# Patient Record
Sex: Male | Born: 1985 | Race: White | Hispanic: No | Smoking: Current every day smoker
Health system: Southern US, Community
[De-identification: ages and names within clinical notes are randomized; demographics above are authoritative.]

## PROBLEM LIST (undated history)

## (undated) DIAGNOSIS — F419 Anxiety disorder, unspecified: Secondary | ICD-10-CM

## (undated) DIAGNOSIS — F1110 Opioid abuse, uncomplicated: Secondary | ICD-10-CM

---
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
## 2006-12-15 ENCOUNTER — Ambulatory Visit (HOSPITAL_BASED_OUTPATIENT_CLINIC_OR_DEPARTMENT_OTHER): Admission: RE | Admit: 2006-12-15 | Discharge: 2006-12-15 | Payer: Self-pay | Admitting: Orthopedic Surgery

## 2010-06-02 ENCOUNTER — Emergency Department (HOSPITAL_COMMUNITY): Admission: EM | Admit: 2010-06-02 | Discharge: 2010-06-02 | Payer: Self-pay | Admitting: Emergency Medicine

## 2011-05-14 NOTE — Op Note (Signed)
Tyler Graves, HERMIZ             ACCOUNT NO.:  [id number]   MEDICAL RECORD NO.:  [id number]          PATIENT TYPE:  AMB   LOCATION:  DSC                          FACILITY:  [facility]   PHYSICIAN:  Katy Fitch. Sypher, M.D. DATE OF BIRTH:  13-Dec-1986   DATE OF PROCEDURE:  12/15/2006  DATE OF DISCHARGE:                               OPERATIVE REPORT   PREOPERATIVE DIAGNOSIS:  Subretinacular dorsal mass, right wrist.   POSTOPERATIVE DIAGNOSIS:  Subretinacular dorsal mass, right wrist.   OPERATION:  Excision of dorsal myxoid/ganglion cyst from overlying  scapholunate interosseous ligament.   OPERATING SURGEON:  Katy Fitch. Sypher, M.D.   ASSISTANT:  Molly Maduro Dasnoit PA-C.   ANESTHESIA:  General by LMA, supervising anesthesiologist is Dr. Krista Blue.   INDICATIONS:  [name] is a 25 year old student referred by Dr.  Shaune Pollack for evaluation and management of a mass on the dorsal aspect  of his right wrist.  [name] had had when he dorsiflexed the wrist  fully or placed weight on his hand in a push-up position.   Clinical examination revealed a small M & M-size mass on the dorsal  aspect of his wrist overlying the scapholunate ligament.  Plain films of  the wrist were nondiagnostic.   We recommended proceeding to excisional biopsy anticipating a  subretinacular dorsal myxoid ganglion cyst.   PROCEDURE:  Drayton Tieu was brought to the operating room and placed  in supine position upon the operating table.  Following an anesthesia  consult by Dr. Krista Blue, general anesthesia by LMA was recommended.  Following induction of general anesthesia under Dr. Robina Ade direct  supervision, a pneumatic tourniquet was applied to [name]'s proximal  right brachium.  The arm was then prepped with Betadine soap and  solution and sterilely draped.  Following exsanguination of the right  arm with an Esmarch bandage, the arterial tourniquet was inflated to 220  mmHg.   The procedure commenced  with a short transverse scission directly over  the scapholunate ligament region.  The subcutaneous tissues were  carefully divided taking care to identify the extensor retinaculum.   The retinaculum was split in the line of its fibers.  The extensor  tendons were retracted in the fourth dorsal compartment in an ulnar  direction.  The mass was circumferentially dissected.  The capsule was  split in the line of its fibers proximal to the dorsal intercarpal  ligament.  The mass was circumferentially dissected and found be a  myxoid mucinous cyst exiting from overlying the scapholunate ligament.  The dorsal capsule involving the cyst and cyst were excised with a  rongeur.  A radiocarpal arthrotomy was accomplished as well as a  midcarpal arthrotomy.  No intra-articular cysts were noted.  Bleeding  points along the margin of the resection line were electrocauterized  with bipolar current.  There were no other masses or predicaments noted.  The scapholunate interosseous ligament appeared to be intact.   The wound was then irrigated and closed with subdermal sutures of 4-0  Vicryl and intradermal 3-0 Prolene with Steri-Strips.   There were no apparent complications.   [name]  tolerated the surgery and anesthesia well.  She was transferred  to the recovery room with stable vital signs.   NOTE:  For aftercare he is provided Vicodin 5 mg one p.o. q.4-6h. p.r.n.  pain, 20 tablets without refill.      Katy Fitch Sypher, M.D.  Electronically Signed     RVS/MEDQ  D:  12/15/2006  T:  12/15/2006  Job:  [id number]   cc:   Duncan Dull, M.D.

## 2012-07-15 ENCOUNTER — Encounter (HOSPITAL_COMMUNITY): Payer: Self-pay

## 2012-07-15 ENCOUNTER — Encounter (HOSPITAL_COMMUNITY): Payer: Self-pay | Admitting: *Deleted

## 2012-07-15 ENCOUNTER — Inpatient Hospital Stay (HOSPITAL_COMMUNITY)
Admission: AD | Admit: 2012-07-15 | Discharge: 2012-07-20 | DRG: 897 | Disposition: A | Discharge status: Home / Self Care | Payer: Commercial Managed Care - PPO | Source: Other Acute Inpatient Hospital | Attending: Emergency Medicine | Admitting: Emergency Medicine

## 2012-07-15 ENCOUNTER — Emergency Department (HOSPITAL_COMMUNITY)
Admission: EM | Admit: 2012-07-15 | Discharge: 2012-07-15 | Disposition: A | Discharge status: Other Acute Inpatient Hospital | Payer: Commercial Managed Care - PPO | Attending: Emergency Medicine | Admitting: Emergency Medicine

## 2012-07-15 DIAGNOSIS — T424X4A Poisoning by benzodiazepines, undetermined, initial encounter: Secondary | ICD-10-CM | POA: Insufficient documentation

## 2012-07-15 DIAGNOSIS — F19239 Other psychoactive substance dependence with withdrawal, unspecified: Secondary | ICD-10-CM

## 2012-07-15 DIAGNOSIS — F19939 Other psychoactive substance use, unspecified with withdrawal, unspecified: Principal | ICD-10-CM

## 2012-07-15 DIAGNOSIS — T43502A Poisoning by unspecified antipsychotics and neuroleptics, intentional self-harm, initial encounter: Secondary | ICD-10-CM | POA: Insufficient documentation

## 2012-07-15 DIAGNOSIS — F1920 Other psychoactive substance dependence, uncomplicated: Secondary | ICD-10-CM | POA: Diagnosis present

## 2012-07-15 DIAGNOSIS — F17200 Nicotine dependence, unspecified, uncomplicated: Secondary | ICD-10-CM | POA: Diagnosis present

## 2012-07-15 DIAGNOSIS — R569 Unspecified convulsions: Secondary | ICD-10-CM

## 2012-07-15 DIAGNOSIS — F1994 Other psychoactive substance use, unspecified with psychoactive substance-induced mood disorder: Secondary | ICD-10-CM | POA: Diagnosis present

## 2012-07-15 DIAGNOSIS — T438X2A Poisoning by other psychotropic drugs, intentional self-harm, initial encounter: Secondary | ICD-10-CM | POA: Insufficient documentation

## 2012-07-15 DIAGNOSIS — F13939 Sedative, hypnotic or anxiolytic use, unspecified with withdrawal, unspecified: Secondary | ICD-10-CM

## 2012-07-15 DIAGNOSIS — F13239 Sedative, hypnotic or anxiolytic dependence with withdrawal, unspecified: Secondary | ICD-10-CM

## 2012-07-15 DIAGNOSIS — T50902A Poisoning by unspecified drugs, medicaments and biological substances, intentional self-harm, initial encounter: Secondary | ICD-10-CM

## 2012-07-15 HISTORY — DX: Anxiety disorder, unspecified: F41.9

## 2012-07-15 LAB — CBC
Hemoglobin: 14.3 g/dL (ref 13.0–17.0)
MCH: 30.6 pg (ref 26.0–34.0)
MCHC: 35.7 g/dL (ref 30.0–36.0)
Platelets: 183 10*3/uL (ref 150–400)
RBC: 4.67 MIL/uL (ref 4.22–5.81)

## 2012-07-15 LAB — BASIC METABOLIC PANEL
Calcium: 9.3 mg/dL (ref 8.4–10.5)
GFR calc Af Amer: >90 mL/min (ref >90)
GFR calc non Af Amer: >90 mL/min (ref >90)
Potassium: 4 mEq/L (ref 3.5–5.1)
Sodium: 136 mEq/L (ref 135–145)

## 2012-07-15 LAB — RAPID URINE DRUG SCREEN, HOSP PERFORMED
Barbiturates: NOT DETECTED
Benzodiazepines: POSITIVE — AB
Cocaine: NOT DETECTED

## 2012-07-15 LAB — ACETAMINOPHEN LEVEL: Acetaminophen (Tylenol), Serum: <15 ug/mL (ref 10–30)

## 2012-07-15 LAB — ETHANOL: Alcohol, Ethyl (B): 11 mg/dL (ref 0–11)

## 2012-07-15 LAB — SALICYLATE LEVEL: Salicylate Lvl: <2 mg/dL — ABNORMAL LOW (ref 2.8–20.0)

## 2012-07-15 MED ORDER — METHADONE HCL 5 MG PO TABS: 110.0000 mg | ORAL_TABLET | Freq: Every day | ORAL | Status: DC

## 2012-07-15 MED ORDER — NICOTINE 14 MG/24HR TD PT24
14.0000 mg | MEDICATED_PATCH | Freq: Once | TRANSDERMAL | Status: DC
  Administered 2012-07-15: 14 mg via TRANSDERMAL
  Filled 2012-07-15: qty 1

## 2012-07-15 MED ORDER — IBUPROFEN 600 MG PO TABS: 600.0000 mg | ORAL_TABLET | Freq: Three times a day (TID) | ORAL | Status: DC | PRN

## 2012-07-15 MED ORDER — METHADONE HCL 10 MG PO TABS
110.0000 mg | ORAL_TABLET | Freq: Every day | ORAL | Status: DC
  Administered 2012-07-15: 110 mg via ORAL
  Filled 2012-07-15: qty 11

## 2012-07-15 NOTE — ED Notes (Signed)
ACT team at bedside.  

## 2012-07-15 NOTE — ED Notes (Signed)
Bed:WA28<BR> Expected date:<BR> Expected time:<BR> Means of arrival:<BR> Comments:<BR>

## 2012-07-15 NOTE — ED Provider Notes (Addendum)
Patient sleepy, arouses easily .alert ambulatory Glasgow Coma Score 15. Stable for transfer to behavioral [facility], MD 07/15/12 2210  Doug Sou, MD 07/16/12 1610

## 2012-07-15 NOTE — ED Provider Notes (Addendum)
History     CSN: [id number]  Arrival date & time 07/15/12  0217   First MD Initiated Contact with Patient 07/15/12 [phone]      Chief Complaint  Patient presents with  . Drug Overdose    (Consider location/radiation/quality/duration/timing/severity/associated sxs/prior treatment) HPI Level 5 Caveat: drug intoxication, altered LOC. This is a 26 year old white male who reportedly took 15-2010 mg Xanax tablets and drank several beers. This was apparently triggered by difficulties with his family and he did type a suicide letter. He has a history of schizophrenia and substance abuse. He was noted to be very somnolent on arrival and this persists. He is otherwise been hemodynamically stable per nursing staff.   No past medical history on file.  No past surgical history on file.  No family history on file.  History  Substance Use Topics  . Smoking status: Not on file  . Smokeless tobacco: Not on file  . Alcohol Use: Not on file      Review of Systems  Unable to perform ROS   Allergies  Review of patient's allergies indicates not on file.  Home Medications  No current outpatient prescriptions on file.  BP 94/60  Pulse 60  Temp 97.9 F (36.6 C) (Oral)  Resp 12  SpO2 96%  Physical Exam General: Well-developed, well-nourished male in no acute distress; appearance consistent with age of record HENT: normocephalic, atraumatic Eyes: pupils equal round and reactive to light; extraocular muscle function cannot be assessed Neck: supple Heart: regular rate and rhythm Lungs: clear to auscultation bilaterally Abdomen: soft; nondistended; nontender Extremities: No deformity; pulses normal Neurologic: Somnolent; will arouse but extremely dysarthric and ataxic; noted to move all extremities Skin: Warm and dry    ED Course  Procedures (including critical care time)     MDM   Nursing notes and vitals signs, including pulse oximetry, reviewed.  Summary of this visit's  results, reviewed by myself:  Labs:  Results for orders placed during the hospital encounter of 07/15/12  BASIC METABOLIC PANEL      Component Value Range   Sodium 136  135 - 145 mEq/L   Potassium 4.0  3.5 - 5.1 mEq/L   Chloride 99  96 - 112 mEq/L   CO2 27  19 - 32 mEq/L   Glucose, Bld 85  70 - 99 mg/dL   BUN 11  6 - 23 mg/dL   Creatinine, Ser 6.57  0.50 - 1.35 mg/dL   Calcium 9.3  8.4 - 84.6 mg/dL   GFR calc non Af Amer >90  >90 mL/min   GFR calc Af Amer >90  >90 mL/min  CBC      Component Value Range   WBC 7.5  4.0 - 10.5 K/uL   RBC 4.67  4.22 - 5.81 MIL/uL   Hemoglobin 14.3  13.0 - 17.0 g/dL   HCT 96.2  95.2 - 84.1 %   MCV 85.9  78.0 - 100.0 fL   MCH 30.6  26.0 - 34.0 pg   MCHC 35.7  30.0 - 36.0 g/dL   RDW 32.4  40.1 - 02.7 %   Platelets 183  150 - 400 K/uL  ACETAMINOPHEN LEVEL      Component Value Range   Acetaminophen (Tylenol), Serum <15.0  10 - 30 ug/mL  SALICYLATE LEVEL      Component Value Range   Salicylate Lvl <2.0 (*) 2.8 - 20.0 mg/dL  URINE RAPID DRUG SCREEN (HOSP PERFORMED)      Component Value Range  Opiates NONE DETECTED  NONE DETECTED   Cocaine NONE DETECTED  NONE DETECTED   Benzodiazepines POSITIVE (*) NONE DETECTED   Amphetamines NONE DETECTED  NONE DETECTED   Tetrahydrocannabinol NONE DETECTED  NONE DETECTED   Barbiturates NONE DETECTED  NONE DETECTED  ETHANOL      Component Value Range   Alcohol, Ethyl (B) 11  0 - 11 mg/dL    Date: 40/98/1191 4:78 AM  Rate: 70  Rhythm: normal sinus rhythm  QRS Axis: normal  Intervals: normal  ST/T Wave abnormalities: normal  Conduction Disutrbances: none  Narrative Interpretation: unremarkable  Comparison with previous EKG: None available  ACT to evaluate after patient recovers from his sedatives.          Hanley Seamen, MD 07/15/12 2956  Hanley Seamen, MD 07/15/12 [phone]

## 2012-07-15 NOTE — BH Assessment (Signed)
Assessment Note   Tyler Graves is an 26 y.o. male who presented to [facility] Emergency Department due to suicidal ideations and alleged attempt by taking 15-20 10mg  of Xanax pills along with consuming several beers. During assessment pt was observed by writer to exhibit slow speech and flat affect. Patient reported to writer that several psychosocial factors have caused his recent depression. Patient disclosed that his father's alcoholism has caused stress within their relationship. Patient stated that his father became extremely upset with him last night. "He was mad because I walked in on him watching adult stuff. I apologized. I really didn't care cause we are all men. We are all bachelors. He keeps my anxiety high" Patient stated that along with the stress from his father, he is also dealing with financial strain and conflict with his son's mother. "She told me that she wishes I wasn't Jacob's father. She wishes that I was a dead beat dead. It's too much for me." Patient reported depressive symptoms such as isolation and recent increase in consuming alcohol. Patient disclosed to Clinical research associate that he is currently receiving outpatient substance abuse treatment at a methadone clinic in [place], [place]. Patient reported no past history of inpatient psychiatric treatment and now desires to receive treatment for his depression and emotional distress. Patient denies HI/AVH at this time.   Axis I: Mood Disorder NOS Axis II: No diagnosis Axis III: No past medical history on file. Axis IV: other psychosocial or environmental problems, problems related to social environment and problems with primary support group Axis V: 41-50 serious symptoms  Past Medical History: No past medical history on file.  No past surgical history on file.  Family History: No family history on file.  Social History:  reports that he has been smoking Cigarettes.  He has been smoking about .25 packs per day. He does not have any  smokeless tobacco history on file. He reports that he drinks about 1.8 ounces of alcohol per week. He reports that he does not use illicit drugs.  Additional Social History:  Alcohol / Drug Use Pain Medications: See MAR Prescriptions: See MAR Over the Counter: See MAR History of alcohol / drug use?: Yes Substance #1 Name of Substance 1: ETOH 1 - Age of First Use: 19 1 - Amount (size/oz): varies 1 - Frequency: rarely 1 - Duration: years 1 - Last Use / Amount: 07/14/12 (3 Beers)  CIWA: CIWA-Ar BP: 104/58 mmHg Pulse Rate: 62  COWS:    Allergies: Not on File  Home Medications:  (Not in a hospital admission)  OB/GYN Status:  No LMP for male patient.  General Assessment Data Location of Assessment: [facility] ED Living Arrangements: Parent;Other relatives (Dad & Brother) Can pt return to current living arrangement?: Yes Admission Status: Voluntary Is patient capable of signing voluntary admission?: Yes Transfer from: Acute Hospital Referral Source: Self/Family/Friend  Education Status Is patient currently in school?: No  Risk to self Suicidal Ideation: Yes-Currently Present Suicidal Intent: Yes-Currently Present Is patient at risk for suicide?: Yes Suicidal Plan?: Yes-Currently Present Specify Current Suicidal Plan: OD on Xanax Access to Means: Yes Specify Access to Suicidal Means: Access to Rx's  What has been your use of drugs/alcohol within the last 12 months?: ETOH Previous Attempts/Gestures: No How many times?: 0  Triggers for Past Attempts: None known Intentional Self Injurious Behavior: None Family Suicide History: No Recent stressful life event(s): Conflict (Comment) (Conflict with father who is an alcoholic) Persecutory voices/beliefs?: No Depression: Yes Depression Symptoms: Isolating;Guilt;Loss of interest  in usual pleasures;Feeling worthless/self pity Substance abuse history and/or treatment for substance abuse?: Yes Suicide prevention information given to  non-admitted patients: Not applicable  Risk to Others Homicidal Ideation: No Thoughts of Harm to Others: No Current Homicidal Intent: No Current Homicidal Plan: No Access to Homicidal Means: No Identified Victim: None Reported History of harm to others?: No Assessment of Violence: None Noted Violent Behavior Description: Pt is calm and cooperative Does patient have access to weapons?: No Criminal Charges Pending?: No Does patient have a court date: No  Psychosis Hallucinations: None noted Delusions: None noted  Mental Status Report Appear/Hygiene: Other (Comment) (Appropriate) Eye Contact: Good Motor Activity: Freedom of movement Speech: Slow Level of Consciousness: Quiet/awake Mood: Depressed;Apprehensive Affect: Appropriate to circumstance;Apprehensive;Depressed Anxiety Level: None Thought Processes: Coherent;Relevant Judgement: Impaired Orientation: Person;Place;Time;Situation Obsessive Compulsive Thoughts/Behaviors: None  Cognitive Functioning Concentration: Decreased Memory: Recent Intact;Remote Intact IQ: Average Insight: Fair Impulse Control: Poor Appetite: Fair Weight Loss: 0  Weight Gain: 0  Sleep: Decreased Total Hours of Sleep: 5  Vegetative Symptoms: None  ADLScreening [facility] Assessment Services) Patient's cognitive ability adequate to safely complete daily activities?: Yes Patient able to express need for assistance with ADLs?: Yes Independently performs ADLs?: Yes  Abuse/Neglect [facility]) Physical Abuse: Denies Verbal Abuse: Denies Sexual Abuse: Denies  Prior Inpatient Therapy Prior Inpatient Therapy: No  Prior Outpatient Therapy Prior Outpatient Therapy: Yes Prior Therapy Dates: Current Prior Therapy Facilty/Provider(s): Outpatient Methadone Clinic Reason for Treatment: SA  ADL Screening (condition at time of admission) Patient's cognitive ability adequate to safely complete daily activities?: Yes Patient able to express need for assistance  with ADLs?: Yes Independently performs ADLs?: Yes Weakness of Legs: None Weakness of Arms/Hands: None  Home Assistive Devices/Equipment Home Assistive Devices/Equipment: None  Therapy Consults (therapy consults require a physician order) PT Evaluation Needed: No OT Evalulation Needed: No SLP Evaluation Needed: No Abuse/Neglect Assessment (Assessment to be complete while patient is alone) Physical Abuse: Denies Verbal Abuse: Denies Sexual Abuse: Denies Exploitation of patient/patient's resources: Denies Self-Neglect: Denies Values / Beliefs Cultural Requests During Hospitalization: None Spiritual Requests During Hospitalization: None Consults Spiritual Care Consult Needed: No Social Work Consult Needed: No      Additional Information 1:1 In Past 12 Months?: No CIRT Risk: No Elopement Risk: No Does patient have medical clearance?: Yes     Disposition: Referral to [facility]    On Site Evaluation by:   Reviewed with Physician:     Paulino Door, Earl Lites C 07/15/2012 5:22 PM

## 2012-07-15 NOTE — ED Notes (Signed)
Patient is resting comfortably. 

## 2012-07-15 NOTE — ED Notes (Signed)
Alert x 4, resting, very pleasant ,v/s stable report given to Tyler Bridge RN Pt transferred to [facility] ed . Will monitor.

## 2012-07-15 NOTE — ED Notes (Signed)
PER EMS: pt ingested 15-20 10 mg Xanax and drank 2-4 Coors Lites. Was more alert on EMS arrival, no very sleepy. Reported difficulties with his family and typed a suicide letter. HX: substance abuse, schizophrenia, methadone use. NKDA.

## 2012-07-15 NOTE — Tx Team (Signed)
Initial Interdisciplinary Treatment Plan  PATIENT STRENGTHS: (choose at least two) Ability for insight Work skills  PATIENT STRESSORS: Financial difficulties Loss of not being able to see son regularly* Marital or family conflict Substance abuse   PROBLEM LIST: Problem List/Patient Goals Date to be addressed Date deferred Reason deferred Estimated date of resolution  Depression 07/15/2012     SI      Anxiety      Panic attacks                                     DISCHARGE CRITERIA:  Adequate post-discharge living arrangements Improved stabilization in mood, thinking, and/or behavior Need for constant or close observation no longer present Reduction of life-threatening or endangering symptoms to within safe limits Safe-care adequate arrangements made  PRELIMINARY DISCHARGE PLAN: Return to previous living arrangement  PATIENT/FAMIILY INVOLVEMENT: This treatment plan has been presented to and reviewed with the patient, Tyler Graves, and/or family member.  The patient and family have been given the opportunity to ask questions and make suggestions.  [name] 07/15/2012, 11:54 PM

## 2012-07-15 NOTE — ED Notes (Signed)
Pt received to TCU Rm 28, ambulatory, alert/oriented. Sitter present. Belongings received and put in LOCKER 28.

## 2012-07-15 NOTE — BHH Counselor (Signed)
Per Gabriel Cirri at [facility] pt has been accepted by Se[facility] Colonel to [facility], [phone]. EDP and RN notified. Support paperwork signed and faxed.

## 2012-07-15 NOTE — ED Notes (Signed)
Pt very talkative but pleasant.

## 2012-07-15 NOTE — ED Notes (Signed)
Documentation error. No Foley inserted.

## 2012-07-16 DIAGNOSIS — F1994 Other psychoactive substance use, unspecified with psychoactive substance-induced mood disorder: Secondary | ICD-10-CM | POA: Diagnosis present

## 2012-07-16 MED ORDER — NICOTINE 21 MG/24HR TD PT24
21.0000 mg | MEDICATED_PATCH | Freq: Every day | TRANSDERMAL | Status: DC
  Administered 2012-07-16: 21 mg via TRANSDERMAL

## 2012-07-16 MED ORDER — NICOTINE 21 MG/24HR TD PT24
MEDICATED_PATCH | TRANSDERMAL | Status: AC
  Administered 2012-07-16: 21 mg via TRANSDERMAL
  Filled 2012-07-16: qty 1

## 2012-07-16 MED ORDER — MAGNESIUM HYDROXIDE 400 MG/5ML PO SUSP: 30.0000 mL | Freq: Every day | ORAL | Status: DC | PRN

## 2012-07-16 MED ORDER — ACETAMINOPHEN 325 MG PO TABS
650.0000 mg | ORAL_TABLET | Freq: Four times a day (QID) | ORAL | Status: DC | PRN
  Administered 2012-07-18 – 2012-07-19 (×2): 650 mg via ORAL
  Filled 2012-07-16: qty 2

## 2012-07-16 MED ORDER — TRAZODONE HCL 50 MG PO TABS
50.0000 mg | ORAL_TABLET | Freq: Every evening | ORAL | Status: DC | PRN
  Administered 2012-07-16 – 2012-07-18 (×2): 50 mg via ORAL
  Filled 2012-07-16 (×2): qty 1

## 2012-07-16 MED ORDER — ALUM & MAG HYDROXIDE-SIMETH 200-200-20 MG/5ML PO SUSP: 30.0000 mL | ORAL | Status: DC | PRN

## 2012-07-16 MED ORDER — NICOTINE 21 MG/24HR TD PT24
21.0000 mg | MEDICATED_PATCH | Freq: Every day | TRANSDERMAL | Status: DC
  Administered 2012-07-17 – 2012-07-18 (×2): 21 mg via TRANSDERMAL
  Filled 2012-07-16 (×7): qty 1

## 2012-07-16 NOTE — Progress Notes (Signed)
[facility] Group Notes:  (Counselor/Nursing/MHT/Case Management/Adjunct)  07/16/2012 3:35 PM  Type of Therapy:  After care Planning Group  Pt.  participated in after care planning group and was given [facility] Suicide prevention information and crisis numbers and agreed to use them if needed. Pt. Agreed to use them if needed. Pt. Was also given information on support groups in [employer]. As well as information on the [employer].  The group was told that our monthly theme for today was Healthy supports and booklets were given out and materials in the booklet were explained. Pt. Spoke about just getting to the hospital today and states came to [facility] for depression and a SI attempt. The pt. Stated he was a little drowsy . Pt. participated in group fully and was told that he would be seeing a doctor or PA today.  :   Neila Gear 07/16/2012, 3:35 PM
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
## 2012-07-16 NOTE — Progress Notes (Signed)
Patient ID: Tyler Graves, male   DOB: 17-Feb-1986, 26 y.o.   MRN: [id number]   D: Patient pleasant on approach today. Concerned about not taking his methadone today but physician wants to verify it with the clinic prior to putting in a order for this. Clinic is not open today. Did express concern about withdrawal symptoms. He said to call if any withdrawal symptoms present tonight. Patient said he may be ok as long as he can get something for sleep and trazodone is ordered for him. Reports depression and hopelessness "6" on scale. Currently denies any SI today. Says that he wants to talk with his family and try to resolve issues that lead to his hospitalization. A: Staff will monitor and encourage group attendance. R: Patient calm and cooperating with staff

## 2012-07-16 NOTE — H&P (Signed)
  Pt was seen by me today and I agree with the key elements documented in H&P.  

## 2012-07-16 NOTE — H&P (Signed)
Psychiatric Admission Assessment Adult  Patient Identification:  Tyler Graves  Date of Evaluation:  07/16/2012  Chief Complaint:  Mood Disorder NOS  History of Present Illness: This is a 26 year old caucasian male, admitted to [facility] from the [facility] ED with complaints of suicide attempt by overdose on Xanax tablets. Patient reports, "I attempted to commit suicide Friday night. I bought a bunch of Xanax pills from the streets, came home swallowed all of them and went to bed to die. However, my brother woke up in the middle of the night, tried to wake me up, but I could not respond and they called 911. I attempted to kill myself because I don't like how things are going in my home. The ongoing stress and conflicts finally pushed me to the edge and I almost succumbed to it as well. I have been having suicidal thoughts for a while. My family life is getting worse and is worsening by the day. The way my brother and father are treating me is distressing me. I also have to deal with the mother of my son. She undermines the fact that I am the father this my child, but she said that she wishes that I was not the father. I am depressed for 6 months, but getting into this hospital, I realized that I am not depressed any more. I used to abuse pain killers just to feel okay. But, I stopped all that and currently doing the methadone treatment.  This is my first suicide attempt. Once I get out of here, I will sit down with my father and brother, man to man and discuss with them how else we can live with each other and not drive each other crazy. The stress is unnecessary. I would not want to be on depression medicine because I am not depressed. I want my methadone pills, if I can't have them, I need to be discharged right away".  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  Violent Behaviors: None reported   Past Medical History:   Past Medical History  Diagnosis Date  . Anxiety      Allergies:  No Known Allergies  PTA Medications: Prescriptions prior to admission  Medication Sig Dispense Refill  . METHADONE HCL PO Take 110 mg by mouth daily.         Substance Abuse History in the last 12 months: Substance Age of 1st Use Last Use Amount Specific Type  Nicotine 16 Prior to hosp 3/4 of a pack daily Cigarettes  Alcohol Denies use     Cannabis Denies use     Opiates "I stopped abusing Opiates in Aug of 2012"     Cocaine Denies use     Methamphetamines Denies use     LSD Denies use     Ecstasy Denies use     Benzodiazepines Denies use     Caffeine      Inhalants      Others:  Consequences of Substance Abuse: Medical Consequences:  Liver damage Legal Consequences:  Arrests, jail time Family Consequences:  Family discord  Social History: Current Place of Residence: [place]   Place of Birth: [place]  Family Members: "My father, brother and my son"  Marital Status:  Single  Children: 1  Sons:1  Daughters: 0  Relationships: "I'm single"  Education:  [employer] Financial [employer] Problems/Performance: None reported  Religious Beliefs/Practices: None reported  History of Abuse (Emotional/Phsycial/Sexual): None reported  Occupational Experiences: Employed  Hotel manager History:  None.  Legal History: None reported  Hobbies/Interests: None  reported  Family History:  History reviewed. No pertinent family history.  Mental Status Examination/Evaluation: Objective:  Appearance: Casual and sluggish  Eye Contact::  Fair  Speech:  Slow  Volume:  Normal  Mood:  "I am not depressed"  Affect:  Flat  Thought Process:  Coherent  Orientation:  Full  Thought Content:  Rumination  Suicidal Thoughts:  No  Homicidal Thoughts:  No  Memory:  Immediate;   Good Recent;   Good Remote;   Good  Judgement:  Poor  Insight:  Lacking  Psychomotor Activity:  Normal  Concentration:  Good  Recall:  Good  Akathisia:  No  Handed:  Right  AIMS (if indicated):     Assets:  Desire for Improvement  Sleep:  Number of Hours: 5.25     Laboratory/X-Ray: None Psychological Evaluation(s)      Assessment:    AXIS I:  Substance Induced Mood Disorder and Suicide attempts. AXIS II:  Deferred AXIS III:   Past Medical History  Diagnosis Date  . Anxiety    AXIS IV:  Familial stressors AXIS V:  11-20 some danger of hurting self or others possible OR occasionally fails to maintain minimal personal hygiene OR gross impairment in communication  Treatment Plan/Recommendations: Admit for safety and stabilization Patient declines to be on any antidepressant medications. Would want methadone instead. Group counseling and activities.  Treatment Plan Summary: Daily contact with patient to assess and evaluate symptoms and progress in treatment Medication management  Current Medications:  Current Facility-Administered Medications  Medication Dose Route Frequency Provider Last Rate Last Dose  . acetaminophen (TYLENOL) tablet 650 mg  650 mg Oral Q6H PRN Sanjuana Kava, NP      . alum & mag hydroxide-simeth (MAALOX/MYLANTA) 200-200-20 MG/5ML suspension 30 mL  30 mL Oral Q4H PRN Sanjuana Kava, NP      . magnesium hydroxide (MILK OF MAGNESIA) suspension 30 mL  30 mL Oral Daily PRN Sanjuana Kava, NP      . traZODone (DESYREL) tablet 50 mg  50 mg Oral QHS PRN  Sanjuana Kava, NP       Facility-Administered Medications Ordered in Other Encounters  Medication Dose Route Frequency Provider Last Rate Last Dose  . DISCONTD: ibuprofen (ADVIL,MOTRIN) tablet 600 mg  600 mg Oral Q8H PRN Carlisle Beers Molpus, MD      . DISCONTD: methadone (DOLOPHINE) tablet 110 mg  110 mg Oral Daily Nathan R. Pickering, MD      . DISCONTD: methadone (DOLOPHINE) tablet 110 mg  110 mg Oral Daily Provider Default, MD   110 mg at 07/15/12 1338  . DISCONTD: nicotine (NICODERM CQ - dosed in mg/24 hours) patch 14 mg  14 mg Transdermal Once [employer]. Pickering, MD   14 mg at 07/15/12 1540    Observation Level/Precautions:  Q 15 minute checks for safety  Laboratory:  Per Ed lab findings; (+) for benzodiazepines.  Psychotherapy:  Group  Medications:  See medication list  Routine PRN Medications:  Yes  Consultations: None indicated at this time   Discharge Concerns:  Safety  Other:     Armandina Stammer I 7/21/201311:37 AM

## 2012-07-16 NOTE — [facility] Suicide Risk Assessment (Signed)
Suicide Risk Assessment  Admission Assessment     Demographic factors:  Assessment Details Time of Assessment: Admission Current Mental Status:  See below Loss Factors:  Loss Factors: Loss of significant relationship;Legal issues Historical Factors:  Historical Factors: Family history of mental illness or substance abuse Risk Reduction Factors:  Risk Reduction Factors: Responsible for children under 25 years of age;Sense of responsibility to family;Religious beliefs about death;Employed;Living with another person, especially a relative  CLINICAL FACTORS:   Alcohol/Substance Abuse/Dependencies  COGNITIVE FEATURES THAT CONTRIBUTE TO RISK:  Closed-mindedness    SUICIDE RISK:   Moderate:  Frequent suicidal ideation with limited intensity, and duration, some specificity in terms of plans, no associated intent, good self-control, limited dysphoria/symptomatology, some risk factors present, and identifiable protective factors, including available and accessible social support.  PLAN OF CARE: Mental Status Examination/Evaluation:  Objective: Appearance: Casual and sluggish  Eye Contact:: Fair   Speech: Slow   Volume: Normal   Mood: not good   Affect: Flat   Thought Process: Coherent   Orientation: Full   Thought Content: Rumination   Suicidal Thoughts: No   Homicidal Thoughts: No   Memory: Immediate; Good  Recent; Good  Remote; Good   Judgement: Poor   Insight: Lacking   Psychomotor Activity: Normal   Concentration: Good   Recall: Good   Akathisia: No   Handed: Right   AIMS (if indicated):   Assets: Desire for Improvement   Sleep: Number of Hours: 5.25    Laboratory/X-Ray: None  Psychological Evaluation(s)       Assessment:  AXIS I: Opiod Dep, Benzo Abuse, r/o cognitive d/ nos AXIS II: Deferred  AXIS III:  Past Medical History   Diagnosis  Date   .  Anxiety     AXIS IV: Familial stressors  AXIS V: 11-20 some danger of hurting self or others possible OR occasionally  fails to maintain minimal personal hygiene OR gross impairment in communication  Treatment Plan/Recommendations: Admit for safety and stabilization  Patient declines to be on any antidepressant medications. Would want methadone instead.  Group counseling and activities.   Treatment Plan Summary:  Daily contact with patient to assess and evaluate symptoms and progress in treatment  Medication management Will confirm his methadone rx tomorrow as clinic is closed today. Will observe for withdrawl   Wonda Cerise 07/16/2012, 12:30 PM

## 2012-07-16 NOTE — Progress Notes (Signed)
Patient voluntarily admitted due to SI attempt by taking 15-20 (10mg ) xanax pills with 3 beers. Patient reports that he had written a suicide note which his brother found. Patient reports his stressors are his strained relationship with his baby's mother, his father and his brother. Patient reports that he lives with his father and brother and his mother lives in [place] and is not a part of their lives. Patient reports his baby's mother does not allow him to see his son on a regular basis. Patient currently denies si/hi/a/v hall. Patient is concerned about receiving his methadone while here. Writer informed patient that the doctor would evaluate his medication on tomorrow. Patient given snack, oriented to unit, safety maintained with 15 min. checks, will continue to monitor.

## 2012-07-16 NOTE — Progress Notes (Signed)
Patient ID: Tyler Graves, male   DOB: [birth year], 26 y.o.   MRN: [id number]  [facility] Notes:  (Counselor/Nursing/MHT/Case Management/Adjunct)  07/16/2012 1:15 PM  Type of Therapy:  Group Therapy, Dance/Movement Therapy   Participation Level:  Minimal  Participation Quality: Drowsey   Affect:  Flat  Cognitive:  Appropriate  Insight:  Limited  Engagement in Group:  Limited  Engagement in Therapy:  Limited  Modes of Intervention:  Clarification, Problem-solving, Role-play, Socialization and Support  Summary of Progress/Problems: Therapist discussed the importance of supports and the reason why supports are needed. Therapist discussed the importance of recognizing supports that are good or bad. Therapist asked group to define the term support and what does support mean to them Therapist asked group to name at least five supports in their lives. Therapist asked group what can you do to support yourself when there are no supports available      Rhunette Croft 07/16/2012. 4:05 PM

## 2012-07-17 DIAGNOSIS — F1920 Other psychoactive substance dependence, uncomplicated: Secondary | ICD-10-CM | POA: Diagnosis present

## 2012-07-17 MED ORDER — NAPROXEN 500 MG PO TABS: 500.0000 mg | ORAL_TABLET | Freq: Two times a day (BID) | ORAL | Status: DC | PRN

## 2012-07-17 MED ORDER — CLONIDINE HCL 0.2 MG/24HR TD PTWK
0.2000 mg | MEDICATED_PATCH | TRANSDERMAL | Status: DC
  Administered 2012-07-17: 0.2 mg via TRANSDERMAL
  Filled 2012-07-17 (×2): qty 1

## 2012-07-17 MED ORDER — LOPERAMIDE HCL 2 MG PO CAPS: 2.0000 mg | ORAL_CAPSULE | ORAL | Status: DC | PRN

## 2012-07-17 MED ORDER — ONDANSETRON 4 MG PO TBDP
4.0000 mg | ORAL_TABLET | Freq: Four times a day (QID) | ORAL | Status: DC | PRN
  Administered 2012-07-18 – 2012-07-19 (×5): 4 mg via ORAL
  Filled 2012-07-17 (×2): qty 1

## 2012-07-17 MED ORDER — METHOCARBAMOL 500 MG PO TABS: 500.0000 mg | ORAL_TABLET | Freq: Three times a day (TID) | ORAL | Status: DC | PRN

## 2012-07-17 MED ORDER — BUPROPION HCL ER (SR) 100 MG PO TB12
100.0000 mg | ORAL_TABLET | Freq: Two times a day (BID) | ORAL | Status: DC
  Administered 2012-07-17 – 2012-07-18 (×2): 100 mg via ORAL
  Administered 2012-07-18: 17:00:00 via ORAL
  Administered 2012-07-20: 100 mg via ORAL
  Filled 2012-07-17 (×15): qty 1

## 2012-07-17 MED ORDER — HYDROXYZINE HCL 25 MG PO TABS: 25.0000 mg | ORAL_TABLET | Freq: Four times a day (QID) | ORAL | Status: DC | PRN

## 2012-07-17 MED ORDER — DICYCLOMINE HCL 20 MG PO TABS: 20.0000 mg | ORAL_TABLET | Freq: Four times a day (QID) | ORAL | Status: DC | PRN

## 2012-07-17 NOTE — Progress Notes (Signed)
Patient seen during d/c planning group.  He reports admitting to the hospital due to a suicide attempt.  He is currently denying SI/HI and rates symptoms at one.  Patient shared he should have talked with family about concerns rather than acting out of his thoughts.

## 2012-07-17 NOTE — Progress Notes (Signed)
[facility] MD Progress Note  07/17/2012 10:26 PM  Diagnosis:   Axis I: Substance Induced Mood Disorder and  Polysubstance Dependence Axis II: Deferred Axis III:  Past Medical History  Diagnosis Date  . Anxiety    Axis IV: other psychosocial or environmental problems, problems related to social environment and problems with primary support group Axis V: 41-50 serious symptoms  ADL's:  Intact  Sleep: Fair  Appetite:  Fair  Suicidal Ideation:  Plan:  No Intent:  No Means:  No Homicidal Ideation:  Plan:  No Intent:  No Means:  No  AEB (as evidenced by):  Mental Status Examination/Evaluation: Objective:  Appearance: Casual  Eye Contact::  Fair  Speech:  Clear and Coherent  Volume:  Normal  Mood:  Anxious, Hopeless and Irritable  Affect:  Congruent  Thought Process:  Coherent  Orientation:  Full  Thought Content:  WDL  Suicidal Thoughts:  No  Homicidal Thoughts:  No  Memory:  Immediate;   Fair Recent;   Fair Remote;   Fair  Judgement:  Impaired  Insight:  Lacking  Psychomotor Activity:  Normal  Concentration:  Fair  Recall:  Fair  Akathisia:  No  Handed:  Right  AIMS (if indicated):     Assets:  Communication Skills Desire for Improvement  Sleep:  Number of Hours: 6    Vital Signs:Blood pressure 100/67, pulse 112, temperature 98 F (36.7 C), temperature source Oral, resp. rate 16, height 5\' 7"  (1.702 m), weight 73.483 kg (162 lb). Current Medications: Current Facility-Administered Medications  Medication Dose Route Frequency Provider Last Rate Last Dose  . acetaminophen (TYLENOL) tablet 650 mg  650 mg Oral Q6H PRN Sanjuana Kava, NP      . alum & mag hydroxide-simeth (MAALOX/MYLANTA) 200-200-20 MG/5ML suspension 30 mL  30 mL Oral Q4H PRN Sanjuana Kava, NP      . buPROPion [facility] SR) 12 hr tablet 100 mg  100 mg Oral BID Mike Craze, MD   100 mg at 07/17/12 2106  . cloNIDine (CATAPRES - Dosed in mg/24 hr) patch 0.2 mg  0.2 mg Transdermal NOW Mike Craze,  MD   0.2 mg at 07/17/12 1803  . dicyclomine (BENTYL) tablet 20 mg  20 mg Oral Q6H PRN Mike Craze, MD      . hydrOXYzine (ATARAX/VISTARIL) tablet 25 mg  25 mg Oral Q6H PRN Mike Craze, MD      . loperamide (IMODIUM) capsule 2-4 mg  2-4 mg Oral PRN Mike Craze, MD      . magnesium hydroxide (MILK OF MAGNESIA) suspension 30 mL  30 mL Oral Daily PRN Sanjuana Kava, NP      . methocarbamol (ROBAXIN) tablet 500 mg  500 mg Oral Q8H PRN Mike Craze, MD      . naproxen (NAPROSYN) tablet 500 mg  500 mg Oral BID PRN Mike Craze, MD      . nicotine (NICODERM CQ - dosed in mg/24 hours) patch 21 mg  21 mg Transdermal Q0600 Sanjuana Kava, NP   21 mg at 07/17/12 1610  . ondansetron (ZOFRAN-ODT) disintegrating tablet 4 mg  4 mg Oral Q6H PRN Mike Craze, MD      . traZODone (DESYREL) tablet 50 mg  50 mg Oral QHS PRN Sanjuana Kava, NP   50 mg at 07/16/12 2110    Lab Results: No results found for this or any previous visit (from the past 48 hour(s)).  Physical Findings:  AIMS: Facial and Oral Movements Muscles of Facial Expression: None, normal Lips and Perioral Area: None, normal Jaw: None, normal Tongue: None, normal,Extremity Movements Upper (arms, wrists, hands, fingers): None, normal Lower (legs, knees, ankles, toes): None, normal, Trunk Movements Neck, shoulders, hips: None, normal, Overall Severity Severity of abnormal movements (highest score from questions above): None, normal Incapacitation due to abnormal movements: None, normal Patient's awareness of abnormal movements (rate only patient's report): No Awareness, Dental Status Current problems with teeth and/or dentures?: No Does patient usually wear dentures?: No  CIWA:    COWS:  COWS Total Score: 0   Treatment Plan Summary: Daily contact with patient to assess and evaluate symptoms and progress in treatment Medication management Mood/anxiety less than 3/10 where the scale is 1 is the best and 10 is the worst No  signs/symptoms of withdrawal from abusable substances.  Plan: Start Wellbutrin as he has been on it before with good results.  Start Clonidine protocol for s/s w/d, ie. Aches and GI discomfort.  Asking for methadone to be reinstated.  Will use Clonidine protocol instead.  Rosibel Giacobbe 07/17/2012, 10:26 PM

## 2012-07-17 NOTE — Progress Notes (Signed)
Psychoeducational Group Note  Date:  07/16/2012 Time: 1030  Group Topic/Focus:  Making Healthy Choices:   The focus of this group is to help patients identify negative/unhealthy choices they were using prior to admission and identify positive/healthier coping strategies to replace them upon discharge.  Participation Level:  Active  Participation Quality:  Appropriate and Attentive  Affect:  Appropriate  Cognitive:  Appropriate  Insight:  Good  Engagement in Group:  Good  Additional Comments:   Andres Ege 07/16/2012, 1030

## 2012-07-17 NOTE — Progress Notes (Signed)
(  D) Patient quiet, in bed during assessment. Focused on meeting with the doctor "to get my med straight." (A) Encouraged to attend all groups throughout the day while waiting for the doctor to see him. (R) Patient flat and depressed.

## 2012-07-17 NOTE — H&P (Signed)
Medical/psychiatric screening examination/treatment/procedure(s) were performed by non-physician practitioner.  I have seen and examined this patient and agree the major elements of this evaluation.  

## 2012-07-17 NOTE — Progress Notes (Signed)
Pt observed in the dayroom involved in group this evening.  He reports some anxiety, but says he is not depressed, just frustrated with his home life with his father and brother.  When he is discharged he plans to have a long talk with them about the stress he feels.  He denies SI/HI/AV.  He was started on Wellbutrin tonight.  He voices no other needs/concerns.  Pt encouraged to make his needs known to staff.  Safety checks q15 minutes.

## 2012-07-17 NOTE — Progress Notes (Signed)
Psychoeducational Group Note  Date:  07/17/2012 Time:  2015  Group Topic/Focus:  Wrap-Up Group:   The focus of this group is to help patients review their daily goal of treatment and discuss progress on daily workbooks.  Participation Level:  Active  Participation Quality:  Attentive and Drowsy  Affect:  Appropriate  Cognitive:  Alert  Insight:  Good  Engagement in Group:  Good  Additional Comments:  Patient attended and participated in wrap-up group this evening. Patient stated that he had many goals for this morning and many goals for tomorrow and was able to eat a very large meal at supper time despite feeling depressed.  Mitsuo Budnick, Newton Pigg 07/17/2012, 9:27 PM

## 2012-07-17 NOTE — [facility] Counselor (Signed)
Psychoeducational Group Note  Date:  07/17/2012 Time: 11am  Group Topic/Focus:  Dimensions of Wellness:   The focus of this group is to introduce the topic of wellness and discuss the role each dimension of wellness plays in total health.  Participation Level:  Active  Participation Quality:  Appropriate  Affect:  Appropriate  Cognitive:  Appropriate  Insight:  Good  Engagement in Group:  Limited  Additional Comments:  Aziz was quiet but participated when he was asked questions. He reported wanting to work on his emotional wellness because he often feels overwhelmed by emotions. He wants to start talking to his family more and work on recognizing his emotions.   HUQ, NADIA 07/17/2012, 12:33 PM  Cosigned by: [name], LCSW

## 2012-07-18 NOTE — Progress Notes (Signed)
(  D) Patient in bed this AM but did sit up during assessment. Flat, sad depressed affect with minimal eye contact. He reports that his pain level is "0" but he has generalized muscle aching but declined any PRN's. Patient attributed the muscle aches to detox from prior to Methadone. Patient encouraged to complete Daily Self-Inventory but has not done so. In bed after group sleeping. (A) Writer spent time reviewing his Wellbutrin medication. Patient encouraged and supported. (R) Patient minimally engaged with staff or peers this shift. Joice Lofts RN MS EdS 07/18/2012  9:56 AM

## 2012-07-18 NOTE — Tx Team (Addendum)
Interdisciplinary Treatment Plan Update (Adult)  Date:  07/18/2012  Time Reviewed:  10:32 AM   Progress in Treatment: Attending groups: Yes Participating in groups:  Yes Taking medication as prescribed:  Yes Tolerating medication: Yes Family/Significant othe contact made:  No, attempting contact with father Patient understands diagnosis: Yes Discussing patient identified problems/goals with staff:  Yes Medical problems stabilized or resolved: Yes Denies suicidal/homicidal ideation: Yes Issues/concerns per patient self-inventory:  No  Other:  New problem(s) identified: None  Reason for Continuation of Hospitalization: Withdrawal symptoms  Interventions implemented related to continuation of hospitalization:  Medication stabilization, safety checks q 15 mins, group attendance  Additional comments: Emmert is beginning to attend 12 step meetings on 300 hall tonight  Estimated length of stay:  2 days  Discharge Plan: Blease will discharge home and follow up with [facility]  New goal(s):  Review of initial/current patient goals per problem list:   1.  Goal(s): Decrease depressive symptoms to a rating of 4 or less  Met:  Yes  Target date: by discharge  As evidenced by: [name] rates depression at 1 today  2.  Goal (s): Decrease rating of anxiety to 4 or less  Met:  Yes  Target date: by discharge  As evidenced by: [name] rates anxiety at 2 today  3.  Goal(s): Reduce potential for suicide/self-harm  Met:  Yes  Target date: by discharge  As evidenced by: [name] reports no suicidal thoughts today  4.  Goal(s): Complete detox from methadone in order to reduce dependence on medications for addressing anxiety attacks  Met:  No  Target date: by discharge  As evidenced by: Andriy is currently on day 3 of discharge protocol    Attendees: Patient:  Tyler Graves 07/18/2012 10:32 AM  Family:     Physician:  Dr Orson Aloe, MD 07/18/2012 10:32 AM   Nursing:   Barrie Folk, RN 07/18/2012 10:32 AM  Case Manager:  [name], LCSW 07/18/2012 10:32 AM  Counselor:  [name], LCSW 07/18/2012 10:32 AM  Other:  Reyes Ivan, LCSWA 07/18/2012 10:32 AM  Other:     Other:     Other:      Scribe for Treatment Team:   [name], 07/18/2012 10:32 AM

## 2012-07-18 NOTE — Progress Notes (Signed)
Psychoeducational Group Note  Date:  07/18/2012 Time:  0900  Group Topic/Focus:  Recovery Goals:   The focus of this group is to identify appropriate goals for recovery and establish a plan to achieve them.  Participation Level:  Minimal  Participation Quality:  Drowsy  Affect:  Blunted, Depressed and Flat  Cognitive:  Alert  Insight:  Limited  Engagement in Group:  Limited  Additional Comments:  Tyler Graves's recovery goal was to be more open about his issues and addiction with his family. He is going to work on this by talking more to his family.   [name] 07/18/2012, 11:53 AM

## 2012-07-18 NOTE — Progress Notes (Signed)
[facility] MD Progress Note  07/18/2012 9:48 PM  Diagnosis:   Axis I: Substance Induced Mood Disorder and  Polysubstance Dependence Axis II: Deferred Axis III:  Past Medical History  Diagnosis Date  . Anxiety    Axis IV: other psychosocial or environmental problems, problems related to social environment and problems with primary support group Axis V: 41-50 serious symptoms  ADL's:  Intact  Sleep: Fair  Appetite:  Fair  Suicidal Ideation:  Plan:  No Intent:  No Means:  No Homicidal Ideation:  Plan:  No Intent:  No Means:  No  AEB (as evidenced by):  Mental Status Examination/Evaluation: Objective:  Appearance: Casual  Eye Contact::  Fair  Speech:  Clear and Coherent  Volume:  Normal  Mood:  Anxious, Dysphoric and Irritable  Affect:  Congruent  Thought Process:  Coherent  Orientation:  Full  Thought Content:  WDL  Suicidal Thoughts:  No  Homicidal Thoughts:  No  Memory:  Immediate;   Fair Recent;   Fair Remote;   Fair  Judgement:  Impaired  Insight:  Lacking  Psychomotor Activity:  Normal  Concentration:  Fair  Recall:  Fair  Akathisia:  No  Handed:  Right  AIMS (if indicated):     Assets:  Communication Skills Desire for Improvement  Sleep:  Number of Hours: 6    ROS: GI: having stomach cramps as well as N/V  MS: having leg cramps from withdrawal  Vital Signs:Blood pressure 113/78, pulse 94, temperature 98.5 F (36.9 C), temperature source Oral, resp. rate 16, height 5\' 7"  (1.702 m), weight 73.483 kg (162 lb). Current Medications: Current Facility-Administered Medications  Medication Dose Route Frequency Provider Last Rate Last Dose  . acetaminophen (TYLENOL) tablet 650 mg  650 mg Oral Q6H PRN Sanjuana Kava, NP      . alum & mag hydroxide-simeth (MAALOX/MYLANTA) 200-200-20 MG/5ML suspension 30 mL  30 mL Oral Q4H PRN Sanjuana Kava, NP      . buPROPion [facility] SR) 12 hr tablet 100 mg  100 mg Oral BID Mike Craze, MD      . cloNIDine (CATAPRES -  Dosed in mg/24 hr) patch 0.2 mg  0.2 mg Transdermal NOW Mike Craze, MD   0.2 mg at 07/17/12 1803  . dicyclomine (BENTYL) tablet 20 mg  20 mg Oral Q6H PRN Mike Craze, MD      . hydrOXYzine (ATARAX/VISTARIL) tablet 25 mg  25 mg Oral Q6H PRN Mike Craze, MD      . loperamide (IMODIUM) capsule 2-4 mg  2-4 mg Oral PRN Mike Craze, MD      . magnesium hydroxide (MILK OF MAGNESIA) suspension 30 mL  30 mL Oral Daily PRN Sanjuana Kava, NP      . methocarbamol (ROBAXIN) tablet 500 mg  500 mg Oral Q8H PRN Mike Craze, MD      . naproxen (NAPROSYN) tablet 500 mg  500 mg Oral BID PRN Mike Craze, MD      . nicotine (NICODERM CQ - dosed in mg/24 hours) patch 21 mg  21 mg Transdermal Q0600 Sanjuana Kava, NP   21 mg at 07/18/12 1610  . ondansetron (ZOFRAN-ODT) disintegrating tablet 4 mg  4 mg Oral Q6H PRN Mike Craze, MD   4 mg at 07/18/12 1820  . traZODone (DESYREL) tablet 50 mg  50 mg Oral QHS PRN Sanjuana Kava, NP   50 mg at 07/16/12 2110    Lab Results:  No results found for this or any previous visit (from the past 48 hour(s)).  Physical Findings: AIMS: Facial and Oral Movements Muscles of Facial Expression: None, normal Lips and Perioral Area: None, normal Jaw: None, normal Tongue: None, normal,Extremity Movements Upper (arms, wrists, hands, fingers): None, normal Lower (legs, knees, ankles, toes): None, normal, Trunk Movements Neck, shoulders, hips: None, normal, Overall Severity Severity of abnormal movements (highest score from questions above): None, normal Incapacitation due to abnormal movements: None, normal Patient's awareness of abnormal movements (rate only patient's report): No Awareness, Dental Status Current problems with teeth and/or dentures?: No Does patient usually wear dentures?: No  CIWA:  CIWA-Ar Total: 7  COWS:  COWS Total Score: 3   Treatment Plan Summary: Daily contact with patient to assess and evaluate symptoms and progress in treatment Medication  management Mood/anxiety less than 3/10 where the scale is 1 is the best and 10 is the worst No signs/symptoms of withdrawal from abusable substances.  Plan: Pt seen in treatment team as well as in his room.  Time was spent reviewing his plans for after the hospital as well as how to help him have a soft landing from his opiate use.  He is familiar with the withdrawal process.  I encouraged him to use the Zofran to help settle his stomach so he can eat a little food.  He did that for lunch, but then vomited it all up later.  He asked to eat in the day room so if the urge to vomit swept over him, them he could relieve himself with out disrupting the whole cafeteria.  Will do this for 24 hours only.  Will have him go to the [address] for 12 Step meetings.  Tyler Graves 07/18/2012, 9:48 PM

## 2012-07-18 NOTE — Progress Notes (Signed)
Patient seen during d/c planning group and treatment team.  He reports feeling a lot better today but endorses leg cramps and stomach discomfort. He rates depression at one and anxiety at two and denies SI/HI.  Patient will need outpatient follow up.

## 2012-07-18 NOTE — Progress Notes (Signed)
Pt observed in bed while evening group was held.  Pt lying with a cool washcloth to his forehead.  He reports he has been vomiting earlier in the day and his "goal is to keep is dinner down".  Pt is otherwise cooperative.  He has no scheduled meds for the evening.  He contracts for safety.  He denies HI/AV.  His father has been visiting since dinner.  He states he just needs to rest for the evening.  Pt voices no other needs/concerns.  Safety maintained with q15 minute checks.

## 2012-07-18 NOTE — Progress Notes (Signed)
[facility] Group Notes: (Counselor/Nursing/MHT/Case Management/Adjunct) 07/18/2012   @ 1:15-2:30PM Feelings About Diagnosis  Type of Therapy:  Group Therapy  Participation Level:  Active  Participation Quality: Appropriate, Sharing   Affect:  Blunted  Cognitive:  Appropriate  Insight:  Good  Engagement in Group: Good  Engagement in Therapy:  Good  Modes of Intervention:  Support and Exploration  Summary of Progress/Problems: [name] processed his ideas of self-direction, stating that one should be able to determine the steps and be involved in one's recovery. He went on to say that he has already begun his recovery process by talking openly to his family and dealing with some problems with son's mother. He explored how his beliefs about asking for help have kept him from doing this in the past, and how he has learned that talking and openness is not a weakness. [name] spoke up several times about his desires to have friends who could be a positive influence for him. He talked about the tendency to slide backwards in his disease of substance abuse, and explored various ways that he can be held accountable and supported in recovery, as well as making the point that it is ultimately his job to take steps to further his recovery.   [name], LCSW 07/18/2012  2:54 PM

## 2012-07-18 NOTE — Progress Notes (Signed)
D: Patient came to the window to complain of headache, nausea and insomnia. He rated his headache at 9 on a scale of 1-10 with 10 the worst. He received Zofran once within the past 4 hours, he said it didn't help; " I just need motrin or Tylenol and something to help me go back to sleep. His mood and affect flat and depressed. A: Tylenol 650 mg given for the headache, ginger ale for nausea and trazodone for insomnia. R: Patient received medications without difficulty and returned to his bedroom. Q 15 minute check continues to maintain safety.

## 2012-07-19 ENCOUNTER — Encounter (HOSPITAL_COMMUNITY): Payer: Self-pay | Admitting: Emergency Medicine

## 2012-07-19 LAB — COMPREHENSIVE METABOLIC PANEL
Albumin: 4.2 g/dL (ref 3.5–5.2)
Alkaline Phosphatase: 57 U/L (ref 39–117)
BUN: 10 mg/dL (ref 6–23)
Chloride: 102 mEq/L (ref 96–112)
Potassium: 3.9 mEq/L (ref 3.5–5.1)
Total Bilirubin: 0.4 mg/dL (ref 0.3–1.2)

## 2012-07-19 LAB — CBC WITH DIFFERENTIAL/PLATELET
Basophils Relative: 0 % (ref 0–1)
Hemoglobin: 14.6 g/dL (ref 13.0–17.0)
Lymphocytes Relative: 19 % (ref 12–46)
Lymphs Abs: 2.3 10*3/uL (ref 0.7–4.0)
MCHC: 36.9 g/dL — ABNORMAL HIGH (ref 30.0–36.0)
Monocytes Relative: 6 % (ref 3–12)
Neutro Abs: 9.5 10*3/uL — ABNORMAL HIGH (ref 1.7–7.7)
Neutrophils Relative %: 76 % (ref 43–77)
RBC: 4.83 MIL/uL (ref 4.22–5.81)
WBC: 12.5 10*3/uL — ABNORMAL HIGH (ref 4.0–10.5)

## 2012-07-19 MED ORDER — CHLORDIAZEPOXIDE HCL 25 MG PO CAPS: 25.0000 mg | ORAL_CAPSULE | Freq: Three times a day (TID) | ORAL | Status: DC | PRN

## 2012-07-19 MED ORDER — LORAZEPAM 2 MG/ML IJ SOLN: 2.0000 mg | Freq: Once | INTRAMUSCULAR | Status: DC

## 2012-07-19 MED ORDER — LORAZEPAM 1 MG PO TABS
2.0000 mg | ORAL_TABLET | Freq: Once | ORAL | Status: AC
  Administered 2012-07-19: 2 mg via ORAL
  Filled 2012-07-19: qty 2

## 2012-07-19 MED ORDER — LORAZEPAM 1 MG PO TABS: 1.0000 mg | ORAL_TABLET | Freq: Three times a day (TID) | ORAL | Status: DC | PRN

## 2012-07-19 MED ORDER — LORAZEPAM 1 MG PO TABS
1.0000 mg | ORAL_TABLET | Freq: Three times a day (TID) | ORAL | Status: DC | PRN
  Administered 2012-07-20: 1 mg via ORAL
  Filled 2012-07-19: qty 1

## 2012-07-19 NOTE — ED Provider Notes (Signed)
Medical screening examination/treatment/procedure(s) were performed by non-physician practitioner and as supervising physician I was immediately available for consultation/collaboration.   Gerhard Munch, MD 07/19/12 2350

## 2012-07-19 NOTE — Progress Notes (Signed)
1300 RN Note: Patient in hallways slipped to ground witnessed by MHT. It was stated that patient did not hit head during fall. Patient was having what looked like seizure like activity. His airway remained open throughout. He was not able to speak for approximately 90 seconds. Patient turned on left side and pillow placed under head. Dr. Dan Humphreys gave verbal order to give patient 2 mg Ativan IM which was administered in right gluteal @ 1307. Patient coherent and able to state name at this time. Vital signs recorded on doc flow sheet. Patient able to tell staff that he had a seizure "a while ago when he was detoxing". Patient given emotional support until EMS arrived to transport patient to [facility] ED. Writer attempted to call father without success. Phone number listed in chart and paperwork were for patient's cell phone. Home phone listed did not accept messages. Will continue to try and locate number for father. Joice Lofts RN MS [facility] 07/19/2012  1:39 PM

## 2012-07-19 NOTE — ED Notes (Signed)
ZOX:[id number]<AV> Expected date:07/19/12<BR> Expected time: 1:24 PM<BR> Means of arrival:Ambulance<BR> Comments:<BR> Seizure, pt from [facility]

## 2012-07-19 NOTE — ED Notes (Signed)
EMS reports pt had Ativan 2mg  and Zofran 2mg  prior to transfer to ER.

## 2012-07-19 NOTE — Progress Notes (Signed)
Psychoeducational Group Note  Date:  07/19/2012 Time:  1100  Group Topic/Focus:  Personal Choices and Values:   The focus of this group is to help patients assess and explore the importance of values in their lives, how their values affect their decisions, how they express their values and what opposes their expression.  Participation Level:  Active  Participation Quality:  Appropriate, Attentive and Supportive  Affect:  Appropriate  Cognitive:  Alert and Appropriate  Insight:  Good  Engagement in Group:  Good  Additional Comments:  Pt was appropriate and active while attending group. Pt shared that two of his values were having a relationship with his son and being financially stable.    Sharyn Lull 07/19/2012, 1:10 PM

## 2012-07-19 NOTE — Progress Notes (Signed)
No pcp listed and pt can not remember pcp name.  Pt inquired about his father.  CNA in room with pt assisted pt with contacting his father

## 2012-07-19 NOTE — Progress Notes (Signed)
D: Patient returned from the Hospital at about 2030. He reported feeling better. His mood and affect appropriate. He denied SI/HI, denied hallucinations and denied withdrawal symptoms. He said he thought he was feeling better; then the seizure. He said he actually felt better in the morning than the previous day. He came with a prescription for Ativan and was treated at Ed with Ativan per ED report. Diagnosed with benzodiazepine induced withdrawal seizure. A: Writer assessed patient, encouraged and supported him. Notified MD on call about patient and the prescription he brought from the hospital. Dr Lolly Mustache  gave an order to continue Ativan TID PRN within the next 24 hours and switch to Librium after 24 hours. Writer put orders in the system as instructed by MD. Ativan 1 mg TID prn starting now for 24 hours and Librium for tomorrow after the Ativan expires. R: Pt requested for blanket and said he wanted to take a nap. He also asked writer if he would still be discharged tomorrow. Writer told patient that the physician would let him know about that in the morning.

## 2012-07-19 NOTE — ED Notes (Signed)
Per EMS, pt from [facility] with witnessed seizure x87min.

## 2012-07-19 NOTE — ED Provider Notes (Signed)
History     CSN: [id number]  Arrival date & time 07/19/12  1343   First MD Initiated Contact with Patient 07/19/12 1756      No chief complaint on file.   (Consider location/radiation/quality/duration/timing/severity/associated sxs/prior treatment) Patient is a 25 y.o. male presenting with seizures. The history is provided by the patient.  Seizures  This is a new problem. The current episode started 3 to 5 hours ago. There was 1 seizure. The most recent episode lasted less than 30 seconds. Pertinent negatives include no headaches, no speech difficulty, no nausea and no vomiting. Characteristics include rhythmic jerking. Characteristics do not include bowel incontinence, bladder incontinence or bit tongue. Possible causes include med or dosage change. benzos, opiods withdrawl There has been no fever.  Pt states from [facility], there for detox from pain meds, benzos, alcohol. Per staff, pt had an episode where he started shaking, lowered himself into a chair, and then fell on the floor, while having rhythmic jerking movements. Pt received 2mg  aof ativan and zofran. Pt was confused after this episode. Pt states has had seizure in the past also while withdrawing from medications. Pt also has been taken off of wellbutrin which he was taking for his depression.  Past Medical History  Diagnosis Date  . Anxiety     History reviewed. No pertinent past surgical history.  History reviewed. No pertinent family history.  History  Substance Use Topics  . Smoking status: Current Everyday Smoker -- 0.2 packs/day for 8 years    Types: Cigarettes  . Smokeless tobacco: Not on file  . Alcohol Use: No     Drinks occasionally       Review of Systems  Constitutional: Negative for fever and chills.  HENT: Negative for neck pain and neck stiffness.   Respiratory: Negative for chest tightness and shortness of breath.   Cardiovascular: Negative.   Gastrointestinal: Negative for nausea, vomiting, abdominal  pain and bowel incontinence.  Genitourinary: Negative for bladder incontinence, dysuria, frequency and flank pain.  Musculoskeletal: Negative.   Skin: Negative.   Neurological: Positive for seizures. Negative for dizziness, speech difficulty, weakness, numbness and headaches.    Allergies  Review of patient's allergies indicates no known allergies.  Home Medications   Current Outpatient Rx  Name Route Sig Dispense Refill  . METHADONE HCL PO Oral Take 110 mg by mouth daily.      BP 105/45  Pulse 62  Temp 97.8 F (36.6 C) (Oral)  Resp 36  Ht 5\' 7"  (1.702 m)  Wt 162 lb (73.483 kg)  BMI 25.37 kg/m2  SpO2 99%  Physical Exam  Nursing note and vitals reviewed. Constitutional: He is oriented to person, place, and time. He appears well-developed and well-nourished.  HENT:  Head: Normocephalic and atraumatic.  Right Ear: External ear normal.  Left Ear: External ear normal.  Nose: Nose normal.  Mouth/Throat: Oropharynx is clear and moist.  Eyes: Conjunctivae and EOM are normal. Pupils are equal, round, and reactive to light.       pupis enlarged bilaterally  Neck: Neck supple.  Pulmonary/Chest: Effort normal and breath sounds normal. No respiratory distress. He has no wheezes. He has no rales.  Abdominal: Soft. Bowel sounds are normal. He exhibits no distension. There is no tenderness. There is no rebound.  Musculoskeletal: Normal range of motion. He exhibits no edema.  Lymphadenopathy:    He has no cervical adenopathy.  Neurological: He is alert and oriented to person, place, and time. He has normal reflexes. No  cranial nerve deficit. He exhibits normal muscle tone. Coordination normal.       No pronator drift, equal upper and lower extremity strength bilaterally  Skin: Skin is warm and dry.  Psychiatric: He has a normal mood and affect. His behavior is normal.    ED Course  Procedures (including critical care time)  Most likeley a withdrawal seizure. Pt in NAD here.  Received 2mg  ativan. Will get labs, electrolytes. Monitor.    Date: 07/19/2012  Rate: 87  Rhythm: normal sinus rhythm  QRS Axis: normal  Intervals: normal  ST/T Wave abnormalities: normal  Conduction Disutrbances: none  Narrative Interpretation:   Old EKG Reviewed: No old  Results for orders placed during the hospital encounter of 07/15/12  GLUCOSE, CAPILLARY      Component Value Range   Glucose-Capillary 140 (*) 70 - 99 mg/dL  CBC WITH DIFFERENTIAL      Component Value Range   WBC 12.5 (*) 4.0 - 10.5 K/uL   RBC 4.83  4.22 - 5.81 MIL/uL   Hemoglobin 14.6  13.0 - 17.0 g/dL   HCT 16.1  09.6 - 04.5 %   MCV 82.0  78.0 - 100.0 fL   MCH 30.2  26.0 - 34.0 pg   MCHC 36.9 (*) 30.0 - 36.0 g/dL   RDW 40.9  81.1 - 91.4 %   Platelets 224  150 - 400 K/uL   Neutrophils Relative 76  43 - 77 %   Neutro Abs 9.5 (*) 1.7 - 7.7 K/uL   Lymphocytes Relative 19  12 - 46 %   Lymphs Abs 2.3  0.7 - 4.0 K/uL   Monocytes Relative 6  3 - 12 %   Monocytes Absolute 0.7  0.1 - 1.0 K/uL   Eosinophils Relative 0  0 - 5 %   Eosinophils Absolute 0.0  0.0 - 0.7 K/uL   Basophils Relative 0  0 - 1 %   Basophils Absolute 0.0  0.0 - 0.1 K/uL  COMPREHENSIVE METABOLIC PANEL      Component Value Range   Sodium 137  135 - 145 mEq/L   Potassium 3.9  3.5 - 5.1 mEq/L   Chloride 102  96 - 112 mEq/L   CO2 23  19 - 32 mEq/L   Glucose, Bld 124 (*) 70 - 99 mg/dL   BUN 10  6 - 23 mg/dL   Creatinine, Ser 7.82  0.50 - 1.35 mg/dL   Calcium 9.3  8.4 - 95.6 mg/dL   Total Protein 7.6  6.0 - 8.3 g/dL   Albumin 4.2  3.5 - 5.2 g/dL   AST 20  0 - 37 U/L   ALT 24  0 - 53 U/L   Alkaline Phosphatase 57  39 - 117 U/L   Total Bilirubin 0.4  0.3 - 1.2 mg/dL   GFR calc non Af Amer >90  >90 mL/min   GFR calc Af Amer >90  >90 mL/min     Pt monitored for 6 hrs.no more seizure activity. No significant electrolyte abnormalities.  Filed Vitals:   07/19/12 1350  BP: 105/45  Pulse: 62  Temp: 97.8 F (36.6 C)  Resp:    Suspect  withdraw seizure. Pt will need careful benzo taper. Will start on ativan. Will send back to behavioral health.   1. Substance induced mood disorder   2. Polysubstance dependence       MDM  Pt with seizure while in detox for opiods and benzos. Here in no distress. Suspect possible benzo  withdrawal seizure. Will put back on  Benzos, will need taper.         Lottie Mussel, PA 07/19/12 1948

## 2012-07-19 NOTE — Progress Notes (Signed)
Patient ID: Tyler Graves, male   DOB: [birth year], 26 y.o.   MRN: [id number] Pt is withdrawing from Methadone and recently put back on Wellbutrin.  He felt funny and slumped to the floor. The tech in the vicinity noted that he did not hit his head.  He simply slumped to the floor and began tonic-clonic seizures.  His CBG was 143.  He was noted to had a fixed "dead" stare gaze with episodic waves of jerks.  It seems that this is the result of restarting Wellbutrin and not having any antiepileptic on board.  The open eyes could strongly mitigate for pseudoseizures.  Sent to the ED to explore other explanations.

## 2012-07-20 DIAGNOSIS — F19239 Other psychoactive substance dependence with withdrawal, unspecified: Secondary | ICD-10-CM

## 2012-07-20 MED ORDER — BUPROPION HCL ER (SR) 100 MG PO TB12: 100.0000 mg | ORAL_TABLET | Freq: Two times a day (BID) | ORAL | Status: DC

## 2012-07-20 MED ORDER — LORAZEPAM 1 MG PO TABS: 1.0000 mg | ORAL_TABLET | Freq: Three times a day (TID) | ORAL | Status: AC | PRN

## 2012-07-20 MED ORDER — TRAZODONE HCL 100 MG PO TABS: 50.0000 mg | ORAL_TABLET | Freq: Every evening | ORAL | Status: DC | PRN

## 2012-07-20 NOTE — Tx Team (Signed)
Interdisciplinary Treatment Plan Update (Adult)  Date:  07/20/2012  Time Reviewed:  10:18 AM   Progress in Treatment: Attending groups: Yes Participating in groups:  Yes Taking medication as prescribed:  Yes Tolerating medication: Yes Family/Significant othe contact made:  Yes, contact made with father Patient understands diagnosis: Yes Discussing patient identified problems/goals with staff:  Yes Medical problems stabilized or resolved: Yes Denies suicidal/homicidal ideation: Yes Issues/concerns per patient self-inventory:  No  Other:  New problem(s) identified: None  Reason for Continuation of Hospitalization: Appropriate for discharge  Interventions implemented related to continuation of hospitalization:  Medication stabilization, safety checks q 15 mins, group attendance  Additional comments:  Estimated length of stay: discharge today  Discharge Plan: Discharge home and follow up with [facility]  New goal(s):  Review of initial/current patient goals per problem list:   1. Goal(s): Decrease depressive symptoms to a rating of 4 or less  Met: Yes  Target date: by discharge  As evidenced by: Tyler Graves rates depression at 1 today 2. Goal (s): Decrease rating of anxiety to 4 or less  Met: Yes  Target date: by discharge  As evidenced by: Tyler Graves rates anxiety at 1  today 3. Goal(s): Reduce potential for suicide/self-harm  Met: Yes  Target date: by discharge  As evidenced by: Tyler Graves reports no suicidal thoughts today 4. Goal(s): Complete detox from methadone in order to reduce dependence on medications for addressing anxiety attacks  Met: No  Target date: by discharge   As evidenced by: Tyler Graves has completed his detox protocol   Attendees: Patient:  Tyler Graves 07/20/2012 10:18 AM  Family:     Physician:  Dr Orson Aloe, MD 07/20/2012 10:18 AM  Nursing:   Berneice Heinrich, RN 07/20/2012 10:18 AM  Case Manager:  [name], LCSW 07/20/2012 10:18 AM    Counselor:  [name], LCSW 07/20/2012 10:18 AM  Other:  Reyes Ivan, LCSWA 07/20/2012 10:18 AM  Other:  Burnetta Sabin, RN 07/20/2012 10:18 AM  Other:  Joslyn Devon, RN 07/20/2012 10:18 AM  Other:      Scribe for Treatment Team:   [name], 07/20/2012 10:18 AM

## 2012-07-20 NOTE — Progress Notes (Signed)
Patient ID: Tyler Graves, male   DOB: 20-Sep-1986, 26 y.o.   MRN: [id number] Patient denies SI/HI A/V hallucinations; reviewed AVS with patient and he verbalized understanding; prescription for ativan and copy of AVS given to patient; patient verbalized that all his belongings were returned to the him; patient's father waiting in the lobby

## 2012-07-20 NOTE — Progress Notes (Signed)
Psychoeducational Group Note  Date:  07/20/2012 Time:  1100  Group Topic/Focus:  Self Esteem Action Plan:   The focus of this group is to help patients create a plan to continue to build self-esteem after discharge.  Participation Level:  Active  Participation Quality:  Appropriate  Affect:  Appropriate  Cognitive:  Alert  Insight:  Good  Engagement in Group:  Good  Additional Comments:  Pt actively attended group this morning, also shared positively with the group.  Jovani Colquhoun E 07/20/2012, 1:41 PM

## 2012-07-20 NOTE — Progress Notes (Signed)
[facility] MD Progress Note  07/20/2012 2:01 PM  Diagnosis:   Axis I: Substance Induced Mood Disorder and Polysubstance Dependence as well as withdrawal seizures on more than one occasion Axis II: Deferred Axis III:  Past Medical History  Diagnosis Date  . Anxiety    Axis IV: other psychosocial or environmental problems, problems related to social environment and problems with primary support group Axis V: 41-50 serious symptoms  ADL's:  Intact  Sleep: Good  Appetite:  Good  Suicidal Ideation:  Plan:  No Intent:  No Means:  No Homicidal Ideation:  Plan:  No Intent:  No Means:  No  AEB (as evidenced by):pt report.  Mental Status Examination/Evaluation: Objective:  Appearance: Casual  Eye Contact::  Fair  Speech:  Clear and Coherent  Volume:  Normal  Mood:  Euthymic  Affect:  Congruent  Thought Process:  Coherent  Orientation:  Full  Thought Content:  WDL  Suicidal Thoughts:  No  Homicidal Thoughts:  No  Memory:  Immediate;   Good Recent;   Good Remote;   Good  Judgement:  Fair  Insight:  Fair  Psychomotor Activity:  Normal  Concentration:  Good  Recall:  Good  Akathisia:  No  Handed:  Right  AIMS (if indicated):     Assets:  Communication Skills Desire for Improvement  Sleep:  Number of Hours: 6    ROS: Neuro: no dizziness, ataxia, or weakness  GI: no N/V/D/cramps/constipation  MS: no cramps, aches, soreness  Vital Signs:Blood pressure 126/81, pulse 116, temperature 98.1 F (36.7 C), temperature source Oral, resp. rate 18, height 5\' 7"  (1.702 m), weight 73.483 kg (162 lb), SpO2 99.00%. Current Medications: Current Facility-Administered Medications  Medication Dose Route Frequency Provider Last Rate Last Dose  . acetaminophen (TYLENOL) tablet 650 mg  650 mg Oral Q6H PRN Sanjuana Kava, NP   650 mg at 07/19/12 1540  . alum & mag hydroxide-simeth (MAALOX/MYLANTA) 200-200-20 MG/5ML suspension 30 mL  30 mL Oral Q4H PRN Sanjuana Kava, NP      . buPROPion  [facility] SR) 12 hr tablet 100 mg  100 mg Oral BID Mike Craze, MD   100 mg at 07/20/12 0757  . chlordiazePOXIDE (LIBRIUM) capsule 25 mg  25 mg Oral TID PRN Cleotis Nipper, MD      . cloNIDine (CATAPRES - Dosed in mg/24 hr) patch 0.2 mg  0.2 mg Transdermal NOW Mike Craze, MD   0.2 mg at 07/17/12 1803  . dicyclomine (BENTYL) tablet 20 mg  20 mg Oral Q6H PRN Mike Craze, MD      . hydrOXYzine (ATARAX/VISTARIL) tablet 25 mg  25 mg Oral Q6H PRN Mike Craze, MD      . loperamide (IMODIUM) capsule 2-4 mg  2-4 mg Oral PRN Mike Craze, MD      . LORazepam (ATIVAN) injection 2 mg  2 mg Intramuscular Once Mike Craze, MD      . LORazepam (ATIVAN) tablet 1 mg  1 mg Oral TID PRN Cleotis Nipper, MD   1 mg at 07/20/12 0532  . LORazepam (ATIVAN) tablet 2 mg  2 mg Oral Once Tatyana A Kirichenko, PA   2 mg at 07/19/12 2003  . magnesium hydroxide (MILK OF MAGNESIA) suspension 30 mL  30 mL Oral Daily PRN Sanjuana Kava, NP      . methocarbamol (ROBAXIN) tablet 500 mg  500 mg Oral Q8H PRN Mike Craze, MD      .  naproxen (NAPROSYN) tablet 500 mg  500 mg Oral BID PRN Mike Craze, MD      . nicotine (NICODERM CQ - dosed in mg/24 hours) patch 21 mg  21 mg Transdermal Q0600 Mike Craze, MD   21 mg at 07/18/12 9811  . ondansetron (ZOFRAN-ODT) disintegrating tablet 4 mg  4 mg Oral Q6H PRN Mike Craze, MD   4 mg at 07/19/12 1540  . traZODone (DESYREL) tablet 50 mg  50 mg Oral QHS PRN Sanjuana Kava, NP   50 mg at 07/18/12 2330    Lab Results:  Results for orders placed during the hospital encounter of 07/15/12 (from the past 48 hour(s))  GLUCOSE, CAPILLARY     Status: Abnormal   Collection Time   07/19/12  1:05 PM      Component Value Range Comment   Glucose-Capillary 140 (*) 70 - 99 mg/dL   CBC WITH DIFFERENTIAL     Status: Abnormal   Collection Time   07/19/12  6:52 PM      Component Value Range Comment   WBC 12.5 (*) 4.0 - 10.5 K/uL    RBC 4.83  4.22 - 5.81 MIL/uL    Hemoglobin 14.6   13.0 - 17.0 g/dL    HCT 91.4  78.2 - 95.6 %    MCV 82.0  78.0 - 100.0 fL    MCH 30.2  26.0 - 34.0 pg    MCHC 36.9 (*) 30.0 - 36.0 g/dL    RDW 21.3  08.6 - 57.8 %    Platelets 224  150 - 400 K/uL    Neutrophils Relative 76  43 - 77 %    Neutro Abs 9.5 (*) 1.7 - 7.7 K/uL    Lymphocytes Relative 19  12 - 46 %    Lymphs Abs 2.3  0.7 - 4.0 K/uL    Monocytes Relative 6  3 - 12 %    Monocytes Absolute 0.7  0.1 - 1.0 K/uL    Eosinophils Relative 0  0 - 5 %    Eosinophils Absolute 0.0  0.0 - 0.7 K/uL    Basophils Relative 0  0 - 1 %    Basophils Absolute 0.0  0.0 - 0.1 K/uL   COMPREHENSIVE METABOLIC PANEL     Status: Abnormal   Collection Time   07/19/12  6:52 PM      Component Value Range Comment   Sodium 137  135 - 145 mEq/L    Potassium 3.9  3.5 - 5.1 mEq/L    Chloride 102  96 - 112 mEq/L    CO2 23  19 - 32 mEq/L    Glucose, Bld 124 (*) 70 - 99 mg/dL    BUN 10  6 - 23 mg/dL    Creatinine, Ser 4.69  0.50 - 1.35 mg/dL    Calcium 9.3  8.4 - 62.9 mg/dL    Total Protein 7.6  6.0 - 8.3 g/dL    Albumin 4.2  3.5 - 5.2 g/dL    AST 20  0 - 37 U/L    ALT 24  0 - 53 U/L    Alkaline Phosphatase 57  39 - 117 U/L    Total Bilirubin 0.4  0.3 - 1.2 mg/dL    GFR calc non Af Amer >90  >90 mL/min    GFR calc Af Amer >90  >90 mL/min     Physical Findings: AIMS: Facial and Oral Movements Muscles of Facial Expression: None, normal Lips  and Perioral Area: None, normal Jaw: None, normal Tongue: None, normal,Extremity Movements Upper (arms, wrists, hands, fingers): None, normal Lower (legs, knees, ankles, toes): None, normal, Trunk Movements Neck, shoulders, hips: None, normal, Overall Severity Severity of abnormal movements (highest score from questions above): None, normal Incapacitation due to abnormal movements: None, normal Patient's awareness of abnormal movements (rate only patient's report): No Awareness, Dental Status Current problems with teeth and/or dentures?: No Does patient usually  wear dentures?: No  CIWA:  CIWA-Ar Total: 0  COWS:  COWS Total Score: 6   Treatment Plan Summary: Daily contact with patient to assess and evaluate symptoms and progress in treatment Medication management Mood/anxiety less than 3/10 where the scale is 1 is the best and 10 is the worst No signs/symptoms of withdrawal from abusable substances.  Plan: Pt reports feeling a lot better.  He has met inpatient goals and is ready for D/C.  Will send out on 7 tabs of Ativan.  Dorothie Wah 07/20/2012, 2:01 PM

## 2012-07-20 NOTE — Progress Notes (Signed)
[facility] Adult Inpatient Family/Significant Other Suicide Prevention Education  Suicide Prevention Education:  Education Completed; Tyler Graves, father, [phone]) has been identified by the patient as the family member/significant other with whom the patient will be residing, and identified as the person(s) who will aid the patient in the event of a mental health crisis (suicidal ideations/suicide attempt).  With written consent from the patient, the family member/significant other has been provided the following suicide prevention education, prior to the and/or following the discharge of the patient.  The suicide prevention education provided includes the following:  Suicide risk factors  Suicide prevention and interventions  National Suicide Hotline telephone number  [facility] assessment telephone number  [facility] Emergency Assistance 911  [facility] and/or Residential Mobile Crisis Unit telephone number  Request made of family/significant other to:  Remove weapons (e.g., guns, rifles, knives), all items previously/currently identified as safety concern.    Remove drugs/medications (over-the-counter, prescriptions, illicit drugs), all items previously/currently identified as a safety concern.  Case manager, Tyler Graves, did suicide prevention education with father when she contacted him about paying for first therapy session with [facility]. She reports that father expressed understanding of suicide prevention information and had no further question. Counselor contacted father to confirm that guns in the home are secured, which they are.   Tyler Graves 07/20/2012, 3:26 PM

## 2012-07-20 NOTE — Discharge Summary (Signed)
Physician Discharge Summary Note  Patient:  Tyler Graves is an 26 y.o., male MRN:  [id number] DOB:  1986/06/02 Patient phone:  [phone] (home)  Patient address:   [address] [place] [address]   Date of Admission:  07/15/2012 Date of Discharge: 07/20/2012  Discharge Diagnoses: Principal Problem:  *Substance induced mood disorder Active Problems:  Polysubstance dependence  Withdrawal seizures  Axis Diagnosis:  Axis I: Substance Induced Mood Disorder and Polysubstance Dependence as well as withdrawal seizures on more than one occasion Axis II: Deferred Axis III:  Past Medical History  Diagnosis Date  . Anxiety    Axis IV: other psychosocial or environmental problems, problems related to social environment and problems with primary support group Axis V: 41-50 serious symptoms  Level of Care:  OP  Hospital Course:   This is a 26 year old caucasian male, admitted to [facility] from the [facility] ED with complaints of suicide attempt by overdose on Xanax tablets. Patient reports, "I attempted to commit suicide Friday night. I bought a bunch of Xanax pills from the streets, came home swallowed all of them and went to bed to die. However, my brother woke up in the middle of the night, tried to wake me up, but I could not respond and they called 911. I attempted to kill myself because I don't like how things are going in my home. The ongoing stress and conflicts finally pushed me to the edge and I almost succumbed to it as well. I have been having suicidal thoughts for a while. My family life is getting worse and is worsening by the day. The way my brother and father are treating me is distressing me. I also have to deal with the mother of my son. She undermines the fact that I am the father this my child, but she said that she wishes that I was not the father. I am depressed for 6 months, but getting into this hospital, I realized that I am not depressed any more. I used to  abuse pain killers just to feel okay. But, I stopped all that and currently doing the methadone treatment. This is my first suicide attempt. Once I get out of here, I will sit down with my father and brother, man to man and discuss with them how else we can live with each other and not drive each other crazy. The stress is unnecessary. I would not want to be on depression medicine because I am not depressed. I want my methadone pills, if I can't have them, I need to be discharged right away".  While a patient in this hospital, Tyler Graves received medication management for withdrawal. They were ordered and received clonidine for opiate withdrawal and Ativan for benzo withdrawal. They were also enrolled in group counseling sessions and activities in which they participated actively.   Patient attended treatment team meeting this am and met with treatment team members. Pt symptoms, treatment plan and response to treatment discussed. Tyler Graves endorsed that their symptoms have improved. Pt also stated that they are stable for discharge.  They reported that from this hospital stay they had learned that they need to be more open with their family and about what they need as well as be more honest about what all drugs have been consumed.  In other to maintain their sobriety, they will continue psychiatric care on outpatient basis. They will follow-up at [facility] on 7/30 at 1300.  In  addition they were instructed to take all your medications as prescribed by your mental healthcare provider, to report any adverse effects and or reactions from your medicines to your outpatient provider promptly, patient is instructed and cautioned to not engage in alcohol and or illegal drug use while on prescription medicines, in the event of worsening symptoms, patient is instructed to call the crisis hotline, 911 and or go to the nearest ED for appropriate evaluation and treatment of symptoms.  In addition  they were instructed to attend 90 meetings in 90 day.  Upon discharge, patient adamantly denies suicidal, homicidal ideations, auditory, visual hallucinations and or delusional thinking. They left [facility] with all personal belongings via personal transportation in no apparent distress.  Consults:  None  Significant Diagnostic Studies:  labs: CMET and CBC non contributory  Discharge Vitals:   Blood pressure 126/81, pulse 116, temperature 98.1 F (36.7 C), temperature source Oral, resp. rate 18, height 5\' 7"  (1.702 m), weight 73.483 kg (162 lb), SpO2 99.00%..  Mental Status Exam: See Mental Status Examination and Suicide Risk Assessment completed by Attending Physician prior to discharge.  Discharge destination:  Home  Is patient on multiple antipsychotic therapies at discharge:  No  Has Patient had three or more failed trials of antipsychotic monotherapy by history: N/A Recommended Plan for Multiple Antipsychotic Therapies: N/A  Medication List  As of 07/20/2012  2:49 PM   STOP taking these medications         METHADONE HCL PO         TAKE these medications      Indication    buPROPion 100 MG 12 hr tablet   Commonly known as: WELLBUTRIN SR   Take 1 tablet (100 mg total) by mouth 2 (two) times daily. For depression.       LORazepam 1 MG tablet   Commonly known as: ATIVAN   Take 1 tablet (1 mg total) by mouth 3 (three) times daily as needed for anxiety (withdrawals).       traZODone 100 MG tablet   Commonly known as: DESYREL   Take 0.5-1 tablets (50-100 mg total) by mouth at bedtime as needed (insomnia).            Follow-up Information    Follow up with Joeseph Amor - [facility] on 07/25/2012. (You are scheduled with Joeseph Amor on Tuesday, July 25, 2012 at 1:00 PM`)    Contact information:   [address] [place], [place]  [address]  [phone]        Follow-up recommendations:   Activities: Resume typical activities Diet: Resume typical  diet Other: Follow up with outpatient provider and report any side effects to out patient prescriber.  Comments:  Take all your medications as prescribed by your mental healthcare provider. Report any adverse effects and or reactions from your medicines to your outpatient provider promptly. Patient is instructed and cautioned to not engage in alcohol and or illegal drug use while on prescription medicines. In the event of worsening symptoms, patient is instructed to call the crisis hotline, 911 and or go to the nearest ED for appropriate evaluation and treatment of symptoms.  SignedOrson Aloe 07/20/2012 2:49 PM

## 2012-07-20 NOTE — [facility] Suicide Risk Assessment (Signed)
Suicide Risk Assessment  Discharge Assessment     Demographic factors:  Adolescent or young adult;Caucasian    Current Mental Status Per Nursing Assessment::   On Admission:  Suicidal ideation indicated by patient;Suicide plan;Belief that plan would result in death At Discharge:     Loss Factors: Loss of significant relationship;Legal issues  Historical Factors: Family history of mental illness or substance abuse  Continued Clinical Symptoms:  Severe Anxiety and/or Agitation Alcohol/Substance Abuse/Dependencies Epilepsy Previous Psychiatric Diagnoses and Treatments  Cognitive Features That Contribute To Risk:  Thought constriction (tunnel vision)    Suicide Risk:  Minimal: No identifiable suicidal ideation.  Patients presenting with no risk factors but with morbid ruminations; may be classified as minimal risk based on the severity of the depressive symptoms  Discharge Diagnosis:   Axis I: Substance Induced Mood Disorder and Polysubstance Dependence as well as withdrawal seizures on more than one occasion Axis II: Deferred Axis III:  Past Medical History  Diagnosis Date  . Anxiety    Axis IV: other psychosocial or environmental problems, problems related to social environment and problems with primary support group Axis V: 41-50 serious symptoms  Current Mental Status Per Physician: ADL's:  Intact  Sleep: Good  Appetite:  Good  Suicidal Ideation:  Plan:  No Intent:  No Means:  No Homicidal Ideation:  Plan:  No Intent:  No Means:  No  AEB (as evidenced by):pt report.  Mental Status Examination/Evaluation: Objective:  Appearance: Casual  Eye Contact::  Fair  Speech:  Clear and Coherent  Volume:  Normal  Mood:  Euthymic  Affect:  Congruent  Thought Process:  Coherent  Orientation:  Full  Thought Content:  WDL  Suicidal Thoughts:  No  Homicidal Thoughts:  No  Memory:  Immediate;   Good Recent;   Good Remote;   Good  Judgement:  Fair  Insight:   Fair  Psychomotor Activity:  Normal  Concentration:  Good  Recall:  Good  Akathisia:  No  Handed:  Right  AIMS (if indicated):     Assets:  Communication Skills Desire for Improvement  Sleep:  Number of Hours: 6    ROS: Neuro: no dizziness, ataxia, or weakness  GI: no N/V/D/cramps/constipation  MS: no cramps, aches, soreness  Vital Signs:Blood pressure 126/81, pulse 116, temperature 98.1 F (36.7 C), temperature source Oral, resp. rate 18, height 5\' 7"  (1.702 m), weight 73.483 kg (162 lb), SpO2 99.00%. Current Medications: Current Facility-Administered Medications  Medication Dose Route Frequency Provider Last Rate Last Dose  . acetaminophen (TYLENOL) tablet 650 mg  650 mg Oral Q6H PRN Sanjuana Kava, NP   650 mg at 07/19/12 1540  . alum & mag hydroxide-simeth (MAALOX/MYLANTA) 200-200-20 MG/5ML suspension 30 mL  30 mL Oral Q4H PRN Sanjuana Kava, NP      . buPROPion [facility] SR) 12 hr tablet 100 mg  100 mg Oral BID Mike Craze, MD   100 mg at 07/20/12 0757  . chlordiazePOXIDE (LIBRIUM) capsule 25 mg  25 mg Oral TID PRN Cleotis Nipper, MD      . cloNIDine (CATAPRES - Dosed in mg/24 hr) patch 0.2 mg  0.2 mg Transdermal NOW Mike Craze, MD   0.2 mg at 07/17/12 1803  . dicyclomine (BENTYL) tablet 20 mg  20 mg Oral Q6H PRN Mike Craze, MD      . hydrOXYzine (ATARAX/VISTARIL) tablet 25 mg  25 mg Oral Q6H PRN Mike Craze, MD      .  loperamide (IMODIUM) capsule 2-4 mg  2-4 mg Oral PRN Mike Craze, MD      . LORazepam (ATIVAN) injection 2 mg  2 mg Intramuscular Once Mike Craze, MD      . LORazepam (ATIVAN) tablet 1 mg  1 mg Oral TID PRN Cleotis Nipper, MD   1 mg at 07/20/12 0532  . LORazepam (ATIVAN) tablet 2 mg  2 mg Oral Once Tatyana A Kirichenko, PA   2 mg at 07/19/12 2003  . magnesium hydroxide (MILK OF MAGNESIA) suspension 30 mL  30 mL Oral Daily PRN Sanjuana Kava, NP      . methocarbamol (ROBAXIN) tablet 500 mg  500 mg Oral Q8H PRN Mike Craze, MD      . naproxen  (NAPROSYN) tablet 500 mg  500 mg Oral BID PRN Mike Craze, MD      . nicotine (NICODERM CQ - dosed in mg/24 hours) patch 21 mg  21 mg Transdermal Q0600 Mike Craze, MD   21 mg at 07/18/12 8657  . ondansetron (ZOFRAN-ODT) disintegrating tablet 4 mg  4 mg Oral Q6H PRN Mike Craze, MD   4 mg at 07/19/12 1540  . traZODone (DESYREL) tablet 50 mg  50 mg Oral QHS PRN Sanjuana Kava, NP   50 mg at 07/18/12 2330    Lab Results:  Results for orders placed during the hospital encounter of 07/15/12 (from the past 72 hour(s))  GLUCOSE, CAPILLARY     Status: Abnormal   Collection Time   07/19/12  1:05 PM      Component Value Range Comment   Glucose-Capillary 140 (*) 70 - 99 mg/dL   CBC WITH DIFFERENTIAL     Status: Abnormal   Collection Time   07/19/12  6:52 PM      Component Value Range Comment   WBC 12.5 (*) 4.0 - 10.5 K/uL    RBC 4.83  4.22 - 5.81 MIL/uL    Hemoglobin 14.6  13.0 - 17.0 g/dL    HCT 84.6  96.2 - 95.2 %    MCV 82.0  78.0 - 100.0 fL    MCH 30.2  26.0 - 34.0 pg    MCHC 36.9 (*) 30.0 - 36.0 g/dL    RDW 84.1  32.4 - 40.1 %    Platelets 224  150 - 400 K/uL    Neutrophils Relative 76  43 - 77 %    Neutro Abs 9.5 (*) 1.7 - 7.7 K/uL    Lymphocytes Relative 19  12 - 46 %    Lymphs Abs 2.3  0.7 - 4.0 K/uL    Monocytes Relative 6  3 - 12 %    Monocytes Absolute 0.7  0.1 - 1.0 K/uL    Eosinophils Relative 0  0 - 5 %    Eosinophils Absolute 0.0  0.0 - 0.7 K/uL    Basophils Relative 0  0 - 1 %    Basophils Absolute 0.0  0.0 - 0.1 K/uL   COMPREHENSIVE METABOLIC PANEL     Status: Abnormal   Collection Time   07/19/12  6:52 PM      Component Value Range Comment   Sodium 137  135 - 145 mEq/L    Potassium 3.9  3.5 - 5.1 mEq/L    Chloride 102  96 - 112 mEq/L    CO2 23  19 - 32 mEq/L    Glucose, Bld 124 (*) 70 - 99 mg/dL  BUN 10  6 - 23 mg/dL    Creatinine, Ser 4.09  0.50 - 1.35 mg/dL    Calcium 9.3  8.4 - 81.1 mg/dL    Total Protein 7.6  6.0 - 8.3 g/dL    Albumin 4.2  3.5 - 5.2  g/dL    AST 20  0 - 37 U/L    ALT 24  0 - 53 U/L    Alkaline Phosphatase 57  39 - 117 U/L    Total Bilirubin 0.4  0.3 - 1.2 mg/dL    GFR calc non Af Amer >90  >90 mL/min    GFR calc Af Amer >90  >90 mL/min     RISK REDUCTION FACTORS: What pt has learned from hospital stay is that he needs to be more open with his family and about what he needs and more honest with himself about what all drugs he has been taking.  Risk of self harm is elevated by his depression and his addictions, but he has his son, his family, and himself to live for.  Risk of harm to others is minimal in that he has not been involved in fights or had any legal charges filed on him.  Pt seen in treatment team where he divulged the above information. The treatment team concluded that he was ready for discharge and had met his goals for an inpatient setting.  PLAN: Discharge home Continue Medication List  As of 07/20/2012  2:36 PM   STOP taking these medications         METHADONE HCL PO         TAKE these medications      Indication    buPROPion 100 MG 12 hr tablet   Commonly known as: WELLBUTRIN SR   Take 1 tablet (100 mg total) by mouth 2 (two) times daily. For depression.       LORazepam 1 MG tablet   Commonly known as: ATIVAN   Take 1 tablet (1 mg total) by mouth 3 (three) times daily as needed for anxiety (withdrawals).       traZODone 100 MG tablet   Commonly known as: DESYREL   Take 0.5-1 tablets (50-100 mg total) by mouth at bedtime as needed (insomnia).            Follow-up recommendations:  Activities: Resume typical activities Diet: Resume typical diet Other: Follow up with outpatient provider and report any side effects to out patient prescriber.  Plan: Pt reports feeling a lot better.  He has met inpatient goals and is ready for D/C.  Will send out on 7 tabs of Ativan.  Cecilie Heidel 07/20/2012, 2:35 PM

## 2012-07-20 NOTE — Progress Notes (Signed)
[facility] Case Management Discharge Plan:  Will you be returning to the same living situation after discharge: Yes,  Patient to return home with family At discharge, do you have transportation home?:Yes,  Father will transport patient home Do you have the ability to pay for your medications:Yes,  Patient can afford to obtain medications  Interagency Information:     Release of information consent forms completed and in the chart;  Patient's signature needed at discharge.  Patient to Follow up at:  Follow-up Information    Follow up with Tyler Graves - [facility] on 07/25/2012. (You are scheduled with Tyler Graves on Tuesday, July 25, 2012 at 1:00 PM`)    Contact information:   [address] [place], [place]  [address]  [phone]         Patient denies SI/HI:   Yes,  Patient no longer endorsing SI/HI and other thoughts of self harm    Safety Planning and Suicide Prevention discussed:  Yes,  Reviewed during aftercare group  Barrier to discharge identified:Yes,  Limitede support  Summary and Recommendations: Patient encourage to be compliant with medications and follow up with outpatient recommedations    Tyler Graves, Tyler Graves July 07/20/2012, 10:52 AM

## 2012-07-20 NOTE — Progress Notes (Signed)
        [facility] Group Notes: (Counselor/Nursing/MHT/Case Management/Adjunct) 07/20/2012   @1 :15pm [facility] in [facility]  Type of Therapy:  Group Therapy  Participation Level:  Good  Participation Quality:  Good  Affect:  Appropriate  Cognitive:  Appropriate  Insight:  Good  Engagement in Group:  Good  Engagement in Therapy:  Good  Modes of Intervention:  Support and Exploration  Summary of Progress/Problems: Tyler Graves participated with speaker from [facility] [place] and expressed interest in programs [facility] offers. He seemed to relate well with the speaker and shared briefly about his own experience. He stated that he hopes he can take part in recovery groups and classes, and one day can give back and help others as well. Tyler Graves reported that this gave him new hope for himself.    [name] 07/20/2012  3:04 PM

## 2012-07-21 NOTE — Progress Notes (Signed)
[facility] Family/Significant Other Suicide Prevention Education  Suicide Prevention Education:  Contact Attempts: Gerre Pebbles "Johnchristopher Sarvis, father [phone]) has been identified by the patient as the family member/significant other with whom the patient will be residing, and identified as the person(s) who will aid the patient in the event of a mental health crisis.  With written consent from the patient, two attempts were made to provide suicide prevention education, prior to and/or following the patient's discharge.  We were unsuccessful in providing suicide prevention education.  A suicide education pamphlet was given to the patient to share with family/significant other.  Date and time of first attempt: 07/19/2012  3:58pm Date and time of second attempt: 07/20/2012  8:23am  *Counselor was informed on 07/20/2012 at 10:15am that the number given for Mr. Gauthreaux was incorrect. Counselor got the correct number from case manager, [name], LCSW, who had spoken to Mr. Barnier several times and attempted to call that number.   Date and time of third attempt  (at [phone]) : 07/20/2012  11:17am  Suicide prevention information was reviewed and a pamphlet provided to [place]. He verbalized understanding with no further questions.   [name] 07/21/2012, 7:41 AM

## 2012-07-21 NOTE — Progress Notes (Signed)
Patient Discharge Instructions:  After Visit Summary (AVS):   Faxed to:  07/20/2012 Psychiatric Admission Assessment Note:   Faxed to:  07/20/2012 Suicide Risk Assessment - Discharge Assessment:   Faxed to:  07/20/2012 Faxed/Sent to the Next Level Care provider:  07/20/2012  Faxed to [facility] @ [phone]  Wandra Scot, 07/21/2012, 3:57 PM

## 2014-08-09 ENCOUNTER — Encounter (HOSPITAL_COMMUNITY): Payer: Self-pay | Admitting: Emergency Medicine

## 2014-08-09 ENCOUNTER — Emergency Department (HOSPITAL_COMMUNITY): Payer: No Typology Code available for payment source

## 2014-08-09 ENCOUNTER — Emergency Department (HOSPITAL_COMMUNITY)
Admission: EM | Admit: 2014-08-09 | Discharge: 2014-08-09 | Disposition: A | Discharge status: Home / Self Care | Payer: No Typology Code available for payment source | Attending: Emergency Medicine | Admitting: Emergency Medicine

## 2014-08-09 DIAGNOSIS — F17200 Nicotine dependence, unspecified, uncomplicated: Secondary | ICD-10-CM | POA: Insufficient documentation

## 2014-08-09 DIAGNOSIS — S298XXA Other specified injuries of thorax, initial encounter: Secondary | ICD-10-CM | POA: Diagnosis not present

## 2014-08-09 DIAGNOSIS — T07XXXA Unspecified multiple injuries, initial encounter: Secondary | ICD-10-CM | POA: Insufficient documentation

## 2014-08-09 DIAGNOSIS — Y9389 Activity, other specified: Secondary | ICD-10-CM | POA: Diagnosis not present

## 2014-08-09 DIAGNOSIS — Z79899 Other long term (current) drug therapy: Secondary | ICD-10-CM | POA: Diagnosis not present

## 2014-08-09 DIAGNOSIS — F411 Generalized anxiety disorder: Secondary | ICD-10-CM | POA: Insufficient documentation

## 2014-08-09 DIAGNOSIS — Y92410 Unspecified street and highway as the place of occurrence of the external cause: Secondary | ICD-10-CM | POA: Diagnosis not present

## 2014-08-09 DIAGNOSIS — IMO0002 Reserved for concepts with insufficient information to code with codable children: Secondary | ICD-10-CM | POA: Insufficient documentation

## 2014-08-09 DIAGNOSIS — M549 Dorsalgia, unspecified: Secondary | ICD-10-CM

## 2014-08-09 MED ORDER — HYDROCODONE-ACETAMINOPHEN 5-325 MG PO TABS
1.0000 | ORAL_TABLET | Freq: Four times a day (QID) | ORAL | Status: DC | PRN
Start: 2014-08-09 — End: 2014-08-21

## 2014-08-09 MED ORDER — HYDROCODONE-ACETAMINOPHEN 5-325 MG PO TABS
2.0000 | ORAL_TABLET | Freq: Once | ORAL | Status: AC
  Administered 2014-08-09: 2 via ORAL
  Filled 2014-08-09: qty 2

## 2014-08-09 MED ORDER — CYCLOBENZAPRINE HCL 5 MG PO TABS: 5.0000 mg | ORAL_TABLET | Freq: Three times a day (TID) | ORAL | Status: DC | PRN

## 2014-08-09 MED ORDER — IBUPROFEN 200 MG PO TABS: 600.0000 mg | ORAL_TABLET | Freq: Four times a day (QID) | ORAL | Status: DC | PRN
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
## 2014-08-09 NOTE — Discharge Instructions (Signed)
Your x-rays are all normal, you been given prescriptions for pain control, and muscle relaxation, and swelling.  Please take these as directed
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
## 2014-08-09 NOTE — ED Notes (Signed)
EMS placed c collar, LSB.  BP 150/90, P90

## 2014-08-09 NOTE — ED Provider Notes (Signed)
X-rays been reviewed.  There is no pathology to be causing patient pain be discharged home with muscle relaxer, and pain medication  Arman FilterGail K Ammon Muscatello, NP 08/09/14 2134

## 2014-08-09 NOTE — ED Notes (Signed)
Discharge instruction reviewed with patient who voiced understanding.  Reviewed at length the need to take the Motrin/Advil and the pain med and Flexeril

## 2014-08-10 NOTE — ED Provider Notes (Signed)
Medical screening examination/treatment/procedure(s) were performed by non-physician practitioner and as supervising physician I was immediately available for consultation/collaboration.   Coralee Edberg T Nuria Phebus, MD 08/10/14 1450 

## 2014-08-10 NOTE — ED Provider Notes (Signed)
Medical screening examination/treatment/procedure(s) were performed by non-physician practitioner and as supervising physician I was immediately available for consultation/collaboration.   Lorinda Copland T Keiyon Plack, MD 08/10/14 1450 

## 2014-08-21 ENCOUNTER — Emergency Department (HOSPITAL_COMMUNITY)
Admission: EM | Admit: 2014-08-21 | Discharge: 2014-08-21 | Disposition: A | Discharge status: Home / Self Care | Payer: No Typology Code available for payment source | Attending: Emergency Medicine | Admitting: Emergency Medicine

## 2014-08-21 ENCOUNTER — Encounter (HOSPITAL_COMMUNITY): Payer: Self-pay | Admitting: Emergency Medicine

## 2014-08-21 DIAGNOSIS — R55 Syncope and collapse: Secondary | ICD-10-CM | POA: Insufficient documentation

## 2014-08-21 DIAGNOSIS — F17200 Nicotine dependence, unspecified, uncomplicated: Secondary | ICD-10-CM | POA: Insufficient documentation

## 2014-08-21 DIAGNOSIS — Z8659 Personal history of other mental and behavioral disorders: Secondary | ICD-10-CM | POA: Insufficient documentation

## 2014-08-21 DIAGNOSIS — R112 Nausea with vomiting, unspecified: Secondary | ICD-10-CM | POA: Insufficient documentation

## 2014-08-21 DIAGNOSIS — R42 Dizziness and giddiness: Secondary | ICD-10-CM | POA: Insufficient documentation

## 2014-08-21 LAB — I-STAT CHEM 8, ED
BUN: 12 mg/dL (ref 6–23)
CALCIUM ION: 1.05 mmol/L — AB (ref 1.12–1.23)
CHLORIDE: 104 meq/L (ref 96–112)
Creatinine, Ser: 0.9 mg/dL (ref 0.50–1.35)
Glucose, Bld: 91 mg/dL (ref 70–99)
HEMATOCRIT: 42 % (ref 39.0–52.0)
Hemoglobin: 14.3 g/dL (ref 13.0–17.0)
POTASSIUM: 4.3 meq/L (ref 3.7–5.3)
Sodium: 143 mEq/L (ref 137–147)
TCO2: 22 mmol/L (ref 0–100)

## 2014-08-21 MED ORDER — PROMETHAZINE HCL 25 MG/ML IJ SOLN
12.5000 mg | Freq: Once | INTRAMUSCULAR | Status: AC
  Administered 2014-08-21: 12.5 mg via INTRAVENOUS
  Filled 2014-08-21: qty 1

## 2014-08-21 MED ORDER — SODIUM CHLORIDE 0.9 % IV BOLUS (SEPSIS)
1000.0000 mL | INTRAVENOUS | Status: AC
  Administered 2014-08-21: 1000 mL via INTRAVENOUS

## 2014-08-21 MED ORDER — ONDANSETRON 4 MG PO TBDP: ORAL_TABLET | ORAL | Status: DC

## 2014-08-21 NOTE — ED Provider Notes (Signed)
CSN: [id number]     Arrival date & time 08/21/14  1646 History   First MD Initiated Contact with Patient 08/21/14 1711     Chief Complaint  Patient presents with  . Near Syncope  . Emesis     (Consider location/radiation/quality/duration/timing/severity/associated sxs/prior Treatment) Patient is a 28 y.o. male presenting with vomiting. The history is provided by the patient.  Emesis Severity:  Moderate Duration:  2 hours Timing:  Constant Quality:  Stomach contents Progression:  Unchanged Chronicity:  New Recent urination:  Normal Relieved by:  Nothing Worsened by:  Nothing tried Ineffective treatments:  None tried Associated symptoms: no abdominal pain, no diarrhea and no fever     Past Medical History  Diagnosis Date  . Anxiety    History reviewed. No pertinent past surgical history. No family history on file. History  Substance Use Topics  . Smoking status: Current Every Day Smoker -- 0.25 packs/day for 8 years    Types: Cigarettes  . Smokeless tobacco: Not on file  . Alcohol Use: 1.8 oz/week    3 Cans of beer per week     Comment: Drinks occasionally     Review of Systems  Constitutional: Negative for fever.  HENT: Negative for drooling and rhinorrhea.   Eyes: Negative for pain.  Respiratory: Negative for cough.   Cardiovascular: Negative for leg swelling.  Gastrointestinal: Positive for nausea and vomiting. Negative for abdominal pain and diarrhea.  Genitourinary: Negative for dysuria and hematuria.  Musculoskeletal: Negative for gait problem and neck pain.  Skin: Negative for color change.  Neurological: Positive for dizziness. Negative for numbness.  Hematological: Negative for adenopathy.  Psychiatric/Behavioral: Negative for behavioral problems.  All other systems reviewed and are negative.     Allergies  Review of patient's allergies indicates no known allergies.  Home Medications   Prior to Admission medications   Medication Sig Start Date  End Date Taking? Authorizing Provider  cyclobenzaprine (FLEXERIL) 5 MG tablet Take 1 tablet (5 mg total) by mouth 3 (three) times daily as needed for muscle spasms. 08/09/14   Arman Filter, NP  HYDROcodone-acetaminophen (NORCO/VICODIN) 5-325 MG per tablet Take 1 tablet by mouth every 6 (six) hours as needed for moderate pain. 08/09/14   Arman Filter, NP  ibuprofen (ADVIL,MOTRIN) 200 MG tablet Take 3 tablets (600 mg total) by mouth every 6 (six) hours as needed for moderate pain. 08/09/14   Arman Filter, NP   BP 121/68  Pulse 78  Temp(Src) 97 F (36.1 C) (Oral)  Resp 18  SpO2 97% Physical Exam  Nursing note and vitals reviewed. Constitutional: He is oriented to person, place, and time. He appears well-developed and well-nourished.  Nausea and emesis on my exam  HENT:  Head: Normocephalic and atraumatic.  Right Ear: External ear normal.  Left Ear: External ear normal.  Nose: Nose normal.  Mouth/Throat: Oropharynx is clear and moist. No oropharyngeal exudate.  Eyes: Conjunctivae and EOM are normal. Pupils are equal, round, and reactive to light.  Neck: Normal range of motion. Neck supple.  Cardiovascular: Normal rate, regular rhythm, normal heart sounds and intact distal pulses.  Exam reveals no gallop and no friction rub.   No murmur heard. Pulmonary/Chest: Effort normal and breath sounds normal. No respiratory distress. He has no wheezes.  Abdominal: Soft. Bowel sounds are normal. He exhibits no distension. There is no tenderness. There is no rebound and no guarding.  Musculoskeletal: Normal range of motion. He exhibits no edema and no tenderness.  Neurological: He is alert and oriented to person, place, and time.  Skin: Skin is warm and dry.  Psychiatric: He has a normal mood and affect. His behavior is normal.    ED Course  Procedures (including critical care time) Labs Review Labs Reviewed  I-STAT CHEM 8, ED - Abnormal; Notable for the following:    Calcium, Ion 1.05 (*)     All other components within normal limits    Imaging Review No results found.   EKG Interpretation   Date/Time:  Wednesday August 21 2014 16:53:36 EDT Ventricular Rate:  88 PR Interval:  163 QRS Duration: 97 QT Interval:  403 QTC Calculation: 488 R Axis:   70 Text Interpretation:  Sinus rhythm Borderline prolonged QT interval  non-spec t wave change in V2 otherwise no new changes Confirmed by  Thomas Rhude  MD, Masaji Billups ([id number]) on 08/21/2014 5:20:49 PM      MDM   Final diagnoses:  Nausea and vomiting, vomiting of unspecified type    5:31 PM 28 y.o. male w hx of anxiety on methadone who pw N/V and lightheadedness. 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 He states that he was feeling lightheaded during this time. He denies any pain on exam. He is afebrile and vital signs are unremarkable here. He got 600 cc normal saline and 8 mg of Zofran in route. He states that his symptoms are resolving. He denies any syncope or seizure. Symptoms likely related to viral syndrome. Will get i-STAT chem 8, hydrate, treat nausea.  6:58 PM: I interpreted/reviewed the labs and/or imaging which were non-contributory.  Pt feeling better.  I have discussed the diagnosis/risks/treatment options with the patient and believe the pt to be eligible for discharge home to follow-up with his pcp as needed. We also discussed returning to the ED immediately if new or worsening sx occur. We discussed the sx which are most concerning (e.g., intractable vomiting, fever, abd pain) that necessitate immediate return. Medications administered to the patient during their visit and any new prescriptions provided to the patient are listed below.  Medications given during this visit Medications  sodium chloride 0.9 % bolus 1,000 mL (1,000 mLs Intravenous New Bag/Given 08/21/14 1756)  promethazine (PHENERGAN) injection 12.5 mg (12.5 mg  Intravenous Given 08/21/14 1755)    New Prescriptions   ONDANSETRON (ZOFRAN ODT) 4 MG DISINTEGRATING TABLET     ODT q4 hours prn nausea/vomit       Purvis Sheffield, MD 08/21/14 1858

## 2014-08-21 NOTE — Discharge Instructions (Signed)

## 2014-08-21 NOTE — ED Notes (Signed)
Bed: RESA Expected date:  Expected time:  Means of arrival:  Comments: EMS/shob 

## 2014-08-21 NOTE — ED Notes (Signed)
Per EMS, pt stated he started feeling dizzy, lightheaded, and began vomiting at ~4PM today. Pt friend's state he appeared to have a seizure, but pt denies losing consciousness. Pt states [place] when he began feeling sick. Pt is currently taking methadone, last dose was at 1500. Pt given  zofran, 600cc NS en route to hospital. Pt A&Ox4.
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
## 2021-04-26 DIAGNOSIS — Z419 Encounter for procedure for purposes other than remedying health state, unspecified: Secondary | ICD-10-CM | POA: Diagnosis not present

## 2021-05-27 DIAGNOSIS — Z419 Encounter for procedure for purposes other than remedying health state, unspecified: Secondary | ICD-10-CM | POA: Diagnosis not present

## 2021-06-26 DIAGNOSIS — Z419 Encounter for procedure for purposes other than remedying health state, unspecified: Secondary | ICD-10-CM | POA: Diagnosis not present

## 2021-07-27 DIAGNOSIS — Z419 Encounter for procedure for purposes other than remedying health state, unspecified: Secondary | ICD-10-CM | POA: Diagnosis not present

## 2021-08-27 DIAGNOSIS — Z419 Encounter for procedure for purposes other than remedying health state, unspecified: Secondary | ICD-10-CM | POA: Diagnosis not present

## 2021-09-26 DIAGNOSIS — Z419 Encounter for procedure for purposes other than remedying health state, unspecified: Secondary | ICD-10-CM | POA: Diagnosis not present

## 2021-10-27 DIAGNOSIS — Z419 Encounter for procedure for purposes other than remedying health state, unspecified: Secondary | ICD-10-CM | POA: Diagnosis not present

## 2021-11-26 DIAGNOSIS — Z419 Encounter for procedure for purposes other than remedying health state, unspecified: Secondary | ICD-10-CM | POA: Diagnosis not present

## 2021-12-27 DIAGNOSIS — Z419 Encounter for procedure for purposes other than remedying health state, unspecified: Secondary | ICD-10-CM | POA: Diagnosis not present

## 2022-01-27 DIAGNOSIS — Z419 Encounter for procedure for purposes other than remedying health state, unspecified: Secondary | ICD-10-CM | POA: Diagnosis not present

## 2022-02-24 DIAGNOSIS — Z419 Encounter for procedure for purposes other than remedying health state, unspecified: Secondary | ICD-10-CM | POA: Diagnosis not present

## 2022-03-27 DIAGNOSIS — Z419 Encounter for procedure for purposes other than remedying health state, unspecified: Secondary | ICD-10-CM | POA: Diagnosis not present

## 2022-04-05 ENCOUNTER — Encounter: Payer: Self-pay | Admitting: Family Medicine

## 2022-04-05 ENCOUNTER — Telehealth: Payer: 59 | Admitting: Family Medicine

## 2022-04-05 DIAGNOSIS — J019 Acute sinusitis, unspecified: Secondary | ICD-10-CM

## 2022-04-05 DIAGNOSIS — B9689 Other specified bacterial agents as the cause of diseases classified elsewhere: Secondary | ICD-10-CM

## 2022-04-05 MED ORDER — AMOXICILLIN-POT CLAVULANATE 875-125 MG PO TABS: 1.0000 | ORAL_TABLET | Freq: Two times a day (BID) | ORAL | 0 refills | Status: AC

## 2022-04-05 NOTE — Progress Notes (Signed)
?Virtual Visit Consent  ? ?Tyler Graves, you are scheduled for a virtual visit with a [facility] provider today.   ?  ?Just as with appointments in the office, your consent must be obtained to participate.  Your consent will be active for this visit and any virtual visit you may have with one of our providers in the next 365 days.   ?  ?If you have a MyChart account, a copy of this consent can be sent to you electronically.  All virtual visits are billed to your insurance company just like a traditional visit in the office.   ? ?As this is a virtual visit, video technology does not allow for your provider to perform a traditional examination.  This may limit your provider's ability to fully assess your condition.  If your provider identifies any concerns that need to be evaluated in person or the need to arrange testing (such as labs, EKG, etc.), we will make arrangements to do so.   ?  ?Although advances in technology are sophisticated, we cannot ensure that it will always work on either your end or our end.  If the connection with a video visit is poor, the visit may have to be switched to a telephone visit.  With either a video or telephone visit, we are not always able to ensure that we have a secure connection.    ? ?I need to obtain your verbal consent now.   Are you willing to proceed with your visit today?  ?  ?Jeanpierre Thebeau has provided verbal consent on 04/05/2022 for a virtual visit (video or telephone). ?  ?Freddy Finner, NP  ? ?Date: 04/05/2022 8:38 AM ? ? ?Virtual Visit via Video Note  ? ?IFreddy Finner, connected with  Fern Asmar  ([id number], Aug 29, 1986) on 04/05/22 at  8:45 AM EDT by a video-enabled telemedicine application and verified that I am speaking with the correct person using two identifiers. ? ?Location: ?Patient: Virtual Visit Location Patient: Home ?Provider: Virtual Visit Location Provider: Home Office ?  ?I discussed the limitations of evaluation and management by  telemedicine and the availability of in person appointments. The patient expressed understanding and agreed to proceed.   ? ?History of Present Illness: ?Tyler Graves is a 36 y.o. who identifies as a male who was assigned male at birth, and is being seen today for cold symptoms/sinus. Around 7 days ago he started having cold symptoms. Started with nasal drainage. Cough is productive with clear to yellow mucus.  Reports associated symptoms of achy and fatigued.  ?No fevers or chills. Denies chest pain, shortness of breath, sore throat or ear pain. ?Negative for COVID this morning. ? ?Problems:  ?Patient Active Problem List  ? Diagnosis Date Noted  ? 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 Polysubstance dependence (HCC) 07/17/2012  ?  Class: Chronic  ? Substance induced mood disorder (HCC) 07/16/2012  ?  ?Allergies: No Known Allergies ?Medications:  ?Current Outpatient Medications:  ?  methadone (DOLOPHINE) 10 MG/ML solution, Take 50 mg by mouth daily., Disp: , Rfl:  ?  ondansetron (ZOFRAN ODT) 4 MG disintegrating tablet, 4mg  ODT q4 hours prn nausea/vomit, Disp: 15 tablet, Rfl: 0 ? ?Observations/Objective: ?Patient is well-developed, well-nourished in no acute distress.  ?Resting comfortably  at home.  ?Head is normocephalic, atraumatic.  ?No labored breathing.  ?Speech is clear and coherent with logical content.  ?Patient is alert and oriented at baseline.  ?Nasal congestion tone ?Productive cough noted on visit ? ?Assessment  and Plan: ? ?1. Acute bacterial sinusitis ? ?- amoxicillin-clavulanate (AUGMENTIN) 875-125 MG tablet; Take 1 tablet by mouth 2 (two) times daily for 7 days.  Dispense: 14 tablet; Refill: 0 ? ?S&S consistent with sinus infection post viral cold/allergies ?Will treat with Augmentin ?OTC reviewed and on AVS ?Work note for today provided ? ? Reviewed side effects, risks and benefits of medication.   ? ?Patient acknowledged agreement and understanding of the plan.  ? ? ? ?Follow Up  Instructions: ?I discussed the assessment and treatment plan with the patient. The patient was provided an opportunity to ask questions and all were answered. The patient agreed with the plan and demonstrated an understanding of the instructions.  A copy of instructions were sent to the patient via MyChart unless otherwise noted below.  ? ? ? ?The patient was advised to call back or seek an in-person evaluation if the symptoms worsen or if the condition fails to improve as anticipated. ? ?Time:  ?I spent 12 minutes with the patient via telehealth technology discussing the above problems/concerns.   ? ?Freddy Finner, NP ? ?

## 2022-04-05 NOTE — Patient Instructions (Addendum)
Sinusitis, Adult ?Sinusitis is soreness and swelling (inflammation) of your sinuses. Sinuses are hollow spaces in the bones around your face. They are located: ?Around your eyes. ?In the middle of your forehead. ?Behind your nose. ?In your cheekbones. ?Your sinuses and nasal passages are lined with a fluid called mucus. Mucus drains out of your sinuses. Swelling can trap mucus in your sinuses. This lets germs (bacteria, virus, or fungus) grow, which leads to infection. Most of the time, this condition is caused by a virus. ?What are the causes? ?This condition is caused by: ?Allergies. ?Asthma. ?Germs. ?Things that block your nose or sinuses. ?Growths in the nose (nasal polyps). ?Chemicals or irritants in the air. ?Fungus (rare). ?What increases the risk? ?You are more likely to develop this condition if: ?You have a weak body defense system (immune system). ?You do a lot of swimming or diving. ?You use nasal sprays too much. ?You smoke. ?What are the signs or symptoms? ?The main symptoms of this condition are pain and a feeling of pressure around the sinuses. Other symptoms include: ?Stuffy nose (congestion). ?Runny nose (drainage). ?Swelling and warmth in the sinuses. ?Headache. ?Toothache. ?A cough that may get worse at night. ?Mucus that collects in the throat or the back of the nose (postnasal drip). ?Being unable to smell and taste. ?Being very tired (fatigue). ?A fever. ?Sore throat. ?Bad breath. ?How is this diagnosed? ?This condition is diagnosed based on: ?Your symptoms. ?Your medical history. ?A physical exam. ?Tests to find out if your condition is short-term (acute) or long-term (chronic). Your doctor may: ?Check your nose for growths (polyps). ?Check your sinuses using a tool that has a light (endoscope). ?Check for allergies or germs. ?Do imaging tests, such as an MRI or CT scan. ?How is this treated? ?Treatment for this condition depends on the cause and whether it is short-term or long-term. ?If  caused by a virus, your symptoms should go away on their own within 10 days. You may be given medicines to relieve symptoms. They include: ?Medicines that shrink swollen tissue in the nose. ?Medicines that treat allergies (antihistamines). ?A spray that treats swelling of the nostrils.  ?Rinses that help get rid of thick mucus in your nose (nasal saline washes). ?If caused by bacteria, your doctor may wait to see if you will get better without treatment. You may be given antibiotic medicine if you have: ?A very bad infection. ?A weak body defense system. ?If caused by growths in the nose, you may need to have surgery. ?Follow these instructions at home: ?Medicines ?Take, use, or apply over-the-counter and prescription medicines only as told by your doctor. These may include nasal sprays. ?If you were prescribed an antibiotic medicine, take it as told by your doctor. Do not stop taking the antibiotic even if you start to feel better. ?Hydrate and humidify ? ?Drink enough water to keep your pee (urine) pale yellow. ?Use a cool mist humidifier to keep the humidity level in your home above 50%. ?Breathe in steam for 10-15 minutes, 3-4 times a day, or as told by your doctor. You can do this in the bathroom while a hot shower is running. ?Try not to spend time in cool or dry air. ?Rest ?Rest as much as you can. ?Sleep with your head raised (elevated). ?Make sure you get enough sleep each night. ?General instructions ? ?Put a warm, moist washcloth on your face 3-4 times a day, or as often as told by your doctor. This will help with discomfort. ?  Wash your hands often with soap and water. If there is no soap and water, use hand sanitizer. ?Do not smoke. Avoid being around people who are smoking (secondhand smoke). ?Keep all follow-up visits as told by your doctor. This is important. ?Contact a doctor if: ?You have a fever. ?Your symptoms get worse. ?Your symptoms do not get better within 10 days. ?Get help right away  if: ?You have a very bad headache. ?You cannot stop throwing up (vomiting). ?You have very bad pain or swelling around your face or eyes. ?You have trouble seeing. ?You feel confused. ?Your neck is stiff. ?You have trouble breathing. ?Summary ?Sinusitis is swelling of your sinuses. Sinuses are hollow spaces in the bones around your face. ?This condition is caused by tissues in your nose that become inflamed or swollen. This traps germs. These can lead to infection. ?If you were prescribed an antibiotic medicine, take it as told by your doctor. Do not stop taking it even if you start to feel better. ?Keep all follow-up visits as told by your doctor. This is important. ?This information is not intended to replace advice given to you by your health care provider. Make sure you discuss any questions you have with your health care provider. ?Document Revised: 05/15/2018 Document Reviewed: 05/15/2018 ?Elsevier Patient Education ? 2022 [employer] ? ?Amoxicillin; Clavulanic Acid Tablets ?What is this medication? ?AMOXICILLIN; CLAVULANIC ACID (a mox i SIL in; KLAV yoo lan ic AS id) treats infections caused by bacteria. It belongs to a group of medications called penicillin antibiotics. It will not treat colds, the flu, or infections caused by viruses. ?This medicine may be used for other purposes; ask your health care provider or pharmacist if you have questions. ?COMMON BRAND NAME(S): Augmentin ?What should I tell my care team before I take this medication? ?They need to know if you have any of these conditions: ?Kidney disease ?Liver disease ?Mononucleosis ?Stomach or intestine problems such as colitis ?An unusual or allergic reaction to amoxicillin, other penicillin or cephalosporin antibiotics, clavulanic acid, other medications, foods, dyes, or preservatives ?Pregnant or trying to get pregnant ?Breast-feeding ?How should I use this medication? ?Take this medication by mouth. Take it as directed on the prescription  label at the same time every day. Take it with food at the start of a meal or snack. Take all of this medication unless your care team tells you to stop it early. Keep taking it even if you think you are better. ?Talk to your care team about the use of this medication in children. While it may be prescribed for selected conditions, precautions do apply. ?Overdosage: If you think you have taken too much of this medicine contact a poison control center or emergency room at once. ?NOTE: This medicine is only for you. Do not share this medicine with others. ?What if I miss a dose? ?If you miss a dose, take it as soon as you can. If it is almost time for your next dose, take only that dose. Do not take double or extra doses. ?What may interact with this medication? ?Allopurinol ?Anticoagulants ?Birth control pills ?Methotrexate ?Probenecid ?This list may not describe all possible interactions. Give your health care provider a list of all the medicines, herbs, non-prescription drugs, or dietary supplements you use. Also tell them if you smoke, drink alcohol, or use illegal drugs. Some items may interact with your medicine. ?What should I watch for while using this medication? ?Tell your care team if your symptoms do not  start to get better or if they get worse. ?This medication may cause serious skin reactions. They can happen weeks to months after starting the medication. Contact your care team right away if you notice fevers or flu-like symptoms with a rash. The rash may be red or purple and then turn into blisters or peeling of the skin. Or, you might notice a red rash with swelling of the face, lips or lymph nodes in your neck or under your arms. ?Do not treat diarrhea with over the counter products. Contact your care team if you have diarrhea that lasts more than 2 days or if it is severe and watery. ?If you have diabetes, you may get a false-positive result for sugar in your urine. Check with your care team. ?Birth  control may not work properly while you are taking this medication. Talk to your care team about using an extra method of birth control. ?What side effects may I notice from receiving this medication? ?Side effec

## 2022-04-26 DIAGNOSIS — Z419 Encounter for procedure for purposes other than remedying health state, unspecified: Secondary | ICD-10-CM | POA: Diagnosis not present

## 2022-05-27 DIAGNOSIS — Z419 Encounter for procedure for purposes other than remedying health state, unspecified: Secondary | ICD-10-CM | POA: Diagnosis not present

## 2022-06-26 DIAGNOSIS — Z419 Encounter for procedure for purposes other than remedying health state, unspecified: Secondary | ICD-10-CM | POA: Diagnosis not present

## 2022-07-20 ENCOUNTER — Ambulatory Visit (HOSPITAL_COMMUNITY)
Admission: RE | Admit: 2022-07-20 | Discharge: 2022-07-20 | Disposition: A | Discharge status: Home / Self Care | Payer: 59 | Source: Ambulatory Visit | Attending: Internal Medicine | Admitting: Internal Medicine

## 2022-07-20 ENCOUNTER — Ambulatory Visit (INDEPENDENT_AMBULATORY_CARE_PROVIDER_SITE_OTHER): Payer: 59

## 2022-07-20 ENCOUNTER — Encounter (HOSPITAL_COMMUNITY): Payer: Self-pay

## 2022-07-20 VITALS — BP 122/81 | HR 60 | Temp 98.3°F | Resp 18

## 2022-07-20 DIAGNOSIS — M25562 Pain in left knee: Secondary | ICD-10-CM

## 2022-07-20 DIAGNOSIS — S8392XA Sprain of unspecified site of left knee, initial encounter: Secondary | ICD-10-CM | POA: Diagnosis not present

## 2022-07-20 MED ORDER — IBUPROFEN 600 MG PO TABS: 600.0000 mg | ORAL_TABLET | Freq: Four times a day (QID) | ORAL | 0 refills | Status: DC | PRN

## 2022-07-20 NOTE — ED Provider Notes (Signed)
[facility]    CSN: [id number] Arrival date & time: 07/20/22  1141      History   Chief Complaint Chief Complaint  Patient presents with   Knee Pain   Appointment    1200    HPI Tyler Graves is a 36 y.o. male.   Patient presents to urgent care for evaluation of left knee pain that started yesterday when he was at work.  Patient states that he was walking when the knee pain started and describes the pain as a sharp shooting feeling from the inferior patella area to the back of his knee.  Patient states that he frequently hears clicks and pops in his knees bilaterally that are not painful and this is normal for him.  He denies any twisting motions, clicks, pops, falls, or injuries prior to onset of his left knee pain suddenly yesterday.  Patient states that he lifts heavy objects while at work and is on his feet frequently.  He purchased a knee compression sleeve from [employer] and has been wearing this consistently ever since the knee pain started.  He has been taking Advil 400 mg 3 times a day for inflammation and pain prior to arrival to urgent care with significant relief of pain.  He states that the pain is worse with weightbearing on his left leg and with extension at the left knee joint.  Pain is currently a 1 or a 2 on a scale of 0-10 while in a seated position with the knee flexed.  With the knee extended and with weightbearing, pain increases to about a 6 or a 7 on a scale of 0-10.  Last dose of 400 mg Advil was this morning.  Patient states that he has never hurt this knee in the past.  No other aggravating relieving factors identified for patient symptoms at this time.   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 No  pertinent surgical history.     Home Medications    Prior to Admission medications   Medication Sig Start Date End Date Taking? Authorizing Provider  ibuprofen (ADVIL) 600 MG tablet Take 1 tablet (600 mg total) by mouth every 6 (six) hours as needed. 07/20/22  Yes Carlisle Beers, FNP  methadone (DOLOPHINE) 10 MG/ML solution Take 50 mg by mouth daily.    [provider]  ondansetron (ZOFRAN ODT) 4 MG disintegrating tablet 4mg  ODT q4 hours prn nausea/vomit 08/21/14   08/23/14, MD    Family History Family History  Problem Relation Age of Onset   Kidney disease Mother    Heart attack Father     Social History Social History   Tobacco Use   Smoking status: Every Day    Packs/day: 0.25    Years: 8.00    Total pack years: 2.00    Types: Cigarettes  Vaping Use   Vaping Use: Never used  Substance Use Topics   Alcohol use: Not Currently    Alcohol/week: 3.0 standard drinks of alcohol    Types: 3 Cans of beer per week    Comment: sober x6 yrs   Drug use: Not Currently    Types: Other-see comments    Comment: hx of opiate use,  clean since Aug 2012     Allergies   Patient has no known allergies.   Review of Systems Review of Systems Per HPI  Physical Exam Triage Vital Signs ED Triage Vitals  Enc Vitals Group     BP 07/20/22 1202 122/81     Pulse Rate 07/20/22 1202 60     Resp 07/20/22 1202 18     Temp 07/20/22 1202 98.3 F (36.8 C)     Temp Source 07/20/22 1202 Oral     SpO2 07/20/22 1202 96 %     Weight --      Height --      Head Circumference --      Peak Flow --      Pain Score 07/20/22 1204 4     Pain Loc --      Pain Edu? --      Excl. in GC? --    No data found.  Updated Vital Signs BP 122/81   Pulse 60   Temp 98.3 F (36.8 C) (Oral)   Resp 18   SpO2 96%   Visual Acuity Right Eye Distance:   Left Eye Distance:   Bilateral Distance:    Right Eye Near:   Left Eye Near:    Bilateral Near:     Physical Exam Vitals  and nursing note reviewed.  Constitutional:      Appearance: Normal appearance. He is not ill-appearing or toxic-appearing.     Comments: Very pleasant patient sitting on exam in position of comfort table in no acute distress.   HENT:     Head: Normocephalic and atraumatic.     Right Ear: Hearing and external ear normal.     Left Ear: Hearing and external ear normal.     Nose: Nose normal.     Mouth/Throat:     Lips: Pink.     Mouth: Mucous membranes are moist.  Eyes:     General: Lids are normal. Vision grossly intact. Gaze aligned appropriately.     Extraocular Movements: Extraocular movements intact.     Conjunctiva/sclera: Conjunctivae normal.  Pulmonary:     Effort: Pulmonary effort is normal.  Abdominal:     Palpations: Abdomen is soft.  Musculoskeletal:     Cervical back: Neck supple.     Right knee: Normal.     Left knee: No swelling, erythema, ecchymosis or crepitus. Normal range of motion. Tenderness present. Normal alignment. Normal pulse.     Instability Tests: Anterior drawer test negative. Posterior drawer test negative.       Legs:     Comments: Left knee: Tenderness to palpation of the inferior patella that causes a shooting pain to the posterior superior patella area of the knee.  No warmth, erythema, swelling, or obvious deformity noted to inspection or palpation.  No skin color changes.  Sensation is intact distal to injury.  +2 anterior tibialis pulses palpated bilaterally.  Patient is neurovascularly intact.  5/5 strength with abduction and abduction of bilateral lower extremities present.  No pain elicited with valgus or varus stress.  Skin:    General: Skin is warm and dry.     Capillary Refill: Capillary refill takes less than 2 seconds.     Findings: No rash.  Neurological:     General: No focal deficit present.     Mental Status: He is alert and oriented to person, place, and time. Mental status is at baseline.     Cranial Nerves: No dysarthria or facial  asymmetry.     Gait: Gait is intact.  Psychiatric:        Mood and Affect: Mood normal.        Speech: Speech normal.        Behavior: Behavior normal.        Thought Content: Thought content normal.        Judgment: Judgment normal.      UC Treatments / Results  Labs (all labs ordered are listed, but only abnormal results are displayed) Labs Reviewed - No data to display  EKG   Radiology DG Knee Complete 4 Views Left  Result Date: 07/20/2022 CLINICAL DATA:  Left knee pain EXAM: LEFT KNEE - COMPLETE 4+ VIEW COMPARISON:  None Available. FINDINGS: No evidence of fracture, dislocation, or joint effusion. No evidence of arthropathy or other focal bone abnormality. Soft tissues are unremarkable. IMPRESSION: Negative. Electronically Signed   By: Wiliam Ke M.D.   On: 07/20/2022 12:33    Procedures Procedures (including critical care time)  Medications Ordered in UC Medications - No data to display  Initial Impression / Assessment and Plan / UC Course  I have reviewed the triage vital signs and the nursing notes.  Pertinent labs & imaging results that were available during my care of the patient were reviewed by me and considered in my medical decision making (see chart for details).  1.  Sprain of left knee Symptomology and physical exam are consistent with left knee sprain.  X-rays negative for acute bony abnormality to the left knee.  Patient to continue wearing knee brace for support and stability.  Rest, ice, elevation, and compression recommended over the next couple of weeks.  Patient may increase Advil dose to 600 mg of ibuprofen every 6 hours as well as Tylenol 1000 mg every 6 hours as needed for pain and inflammation with food.  Walking referral to orthopedics given if symptoms do not improve with conservative treatment in the next 1 to 2 weeks.  Patient to follow-up with PCP for ongoing healthcare needs as needed and return to urgent care as needed.  Work note given.   Patient agreeable with this plan.   Discussed physical exam and available lab work findings in clinic with patient.  Counseled patient regarding appropriate use of medications and potential side effects for all medications recommended or prescribed today. Discussed red flag signs and symptoms of worsening condition,when to call the PCP office, return to urgent care, and when to seek higher level of care in the emergency department. Patient verbalizes understanding and agreement with plan. All questions answered. Patient discharged in stable condition.  Final Clinical Impressions(s) / UC Diagnoses   Final diagnoses:  Sprain of left knee, unspecified ligament, initial encounter  Acute pain of left knee     Discharge Instructions      Your x-rays of your knee were negative for fracture or dislocation. You likely sprained your knee.   Wear the knee brace for the next couple of weeks to provide compression, stability, and comfort.  Please rest, ice, and elevate your knee to help it heal and decrease inflammation.   Take 600mg  ibuprofen every 6 hours or tylenol 1,000 every 6 hours as needed for pain. If needed, you can alternate these medications so that you take one medication every 3 hours. For instance, at noon take ibuprofen, then at 3pm take tylenol, then at 6pm take ibuprofen.   Call the orthopedic provider listed on your discharge paperwork to schedule a follow-up appointment  if your symptoms do not improve in the next 1-2 weeks with supportive care.  Return to urgent care if you experience worsening pain, numbness, tingling, change of color in your skin near the injury, or any other concerning symptoms.  I hope you feel better!     ED Prescriptions     Medication Sig Dispense Auth. Provider   ibuprofen (ADVIL) 600 MG tablet Take 1 tablet (600 mg total) by mouth every 6 (six) hours as needed. 30 tablet Carlisle Beers, FNP      PDMP not reviewed this encounter.    Carlisle Beers, [place] 07/20/22 1308

## 2022-07-20 NOTE — ED Triage Notes (Addendum)
Pt reports sudden onset left knee pain while taking a step when cleaning a pool yesterday with progressive worsening. Has been wearing a knee sleeve with some relief and applying ice intermittently. Pt states pain significant when he bears weight. Ambulates with limp. Declines Tyl at this time.

## 2022-07-20 NOTE — Discharge Instructions (Signed)
Your x-rays of your knee were negative for fracture or dislocation. You likely sprained your knee.   Wear the knee brace for the next couple of weeks to provide compression, stability, and comfort.  Please rest, ice, and elevate your knee to help it heal and decrease inflammation.   Take 600mg  ibuprofen every 6 hours or tylenol 1,000 every 6 hours as needed for pain. If needed, you can alternate these medications so that you take one medication every 3 hours. For instance, at noon take ibuprofen, then at 3pm take tylenol, then at 6pm take ibuprofen.   Call the orthopedic provider listed on your discharge paperwork to schedule a follow-up appointment if your symptoms do not improve in the next 1-2 weeks with supportive care.  Return to urgent care if you experience worsening pain, numbness, tingling, change of color in your skin near the injury, or any other concerning symptoms.  I hope you feel better!

## 2022-07-27 DIAGNOSIS — Z419 Encounter for procedure for purposes other than remedying health state, unspecified: Secondary | ICD-10-CM | POA: Diagnosis not present

## 2022-08-27 DIAGNOSIS — Z419 Encounter for procedure for purposes other than remedying health state, unspecified: Secondary | ICD-10-CM | POA: Diagnosis not present

## 2022-09-26 DIAGNOSIS — Z419 Encounter for procedure for purposes other than remedying health state, unspecified: Secondary | ICD-10-CM | POA: Diagnosis not present

## 2022-10-24 ENCOUNTER — Telehealth: Payer: 59 | Admitting: Family

## 2022-10-24 DIAGNOSIS — M79671 Pain in right foot: Secondary | ICD-10-CM

## 2022-10-24 DIAGNOSIS — M79674 Pain in right toe(s): Secondary | ICD-10-CM

## 2022-10-24 NOTE — Progress Notes (Signed)
Virtual Visit Consent   Tyler Graves, you are scheduled for a virtual visit with a [facility] provider today. Just as with appointments in the office, your consent must be obtained to participate. Your consent will be active for this visit and any virtual visit you may have with one of our providers in the next 365 days. If you have a MyChart account, a copy of this consent can be sent to you electronically.  As this is a virtual visit, video technology does not allow for your provider to perform a traditional examination. This may limit your provider's ability to fully assess your condition. If your provider identifies any concerns that need to be evaluated in person or the need to arrange testing (such as labs, EKG, etc.), we will make arrangements to do so. Although advances in technology are sophisticated, we cannot ensure that it will always work on either your end or our end. If the connection with a video visit is poor, the visit may have to be switched to a telephone visit. With either a video or telephone visit, we are not always able to ensure that we have a secure connection.  By engaging in this virtual visit, you consent to the provision of healthcare and authorize for your insurance to be billed (if applicable) for the services provided during this visit. Depending on your insurance coverage, you may receive a charge related to this service.  I need to obtain your verbal consent now. Are you willing to proceed with your visit today? Tyler Graves has provided verbal consent on 10/24/2022 for a virtual visit (video or telephone). Tyler Rodney, FNP  Date: 10/24/2022 3:41 PM  Virtual Visit via Video Note   I, Tyler Graves, connected with  Tyler Graves  ([id number], 10-Mar-1986) on 10/24/22 at  3:45 PM EDT by a video-enabled telemedicine application and verified that I am speaking with the correct person using two identifiers.  Location: Patient: Virtual Visit Location Patient:  Home Provider: Virtual Visit Location Provider: Home Office   I discussed the limitations of evaluation and management by telemedicine and the availability of in person appointments. The patient expressed understanding and agreed to proceed.    History of Present Illness: Shlome Baldree is a 36 y.o. who identifies as a male who was assigned male at birth, and is being seen today for great toe pain and foot.  HPI: Foot Injury  The incident occurred 6 to 12 hours ago. The injury mechanism was a fall. The pain is present in the right foot and right toes. The quality of the pain is described as aching. The pain is at a severity of 9/10. The pain is moderate. The pain has been Intermittent since onset. Pertinent negatives include no loss of sensation, numbness or tingling. He reports no foreign bodies present. The symptoms are aggravated by movement and weight bearing.    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  Observations/Objective: Patient is well-developed, well-nourished in no acute distress.  Resting comfortably  at home.  Head is normocephalic, atraumatic.  No labored breathing.  Speech is clear and coherent with logical content.  Patient is alert and oriented at baseline.  Right great toe swelling and bruising   Assessment and Plan: 1. Right foot  pain  2. Pain of right great toe  Given swelling, bruising, and pain recommend follow up for x-ray, he will go to the Urgent Care  Follow Up Instructions: I discussed the assessment and treatment plan with the patient. The patient was provided an opportunity to ask questions and all were answered. The patient agreed with the plan and demonstrated an understanding of the  instructions.  A copy of instructions were sent to the patient via MyChart unless otherwise noted below.   Patient has requested to receive PHI (AVS, Work Notes, etc) pertaining to this video visit through e-mail as they are currently without active [place]. They have voiced understand that email is not considered secure and their health information could be viewed by someone other than the patient.   The patient was advised to call back or seek an in-person evaluation if the symptoms worsen or if the condition fails to improve as anticipated.  Time:  I spent 11 minutes with the patient via telehealth technology discussing the above problems/concerns.    Evelina Dun, FNP

## 2022-10-25 ENCOUNTER — Ambulatory Visit (HOSPITAL_COMMUNITY)
Admission: RE | Admit: 2022-10-25 | Discharge: 2022-10-25 | Disposition: A | Discharge status: Home / Self Care | Payer: 59 | Source: Ambulatory Visit | Attending: Emergency Medicine | Admitting: Emergency Medicine

## 2022-10-25 ENCOUNTER — Ambulatory Visit (HOSPITAL_COMMUNITY): Payer: 59

## 2022-10-25 ENCOUNTER — Encounter (HOSPITAL_COMMUNITY): Payer: Self-pay

## 2022-10-25 ENCOUNTER — Ambulatory Visit (INDEPENDENT_AMBULATORY_CARE_PROVIDER_SITE_OTHER): Payer: 59

## 2022-10-25 VITALS — BP 131/74 | HR 76 | Temp 98.0°F | Resp 12

## 2022-10-25 DIAGNOSIS — M79674 Pain in right toe(s): Secondary | ICD-10-CM | POA: Diagnosis not present

## 2022-10-25 DIAGNOSIS — S99921A Unspecified injury of right foot, initial encounter: Secondary | ICD-10-CM | POA: Diagnosis not present

## 2022-10-25 NOTE — Discharge Instructions (Addendum)
Your xray was negative. You may have a soft tissue injury causing your discomfort.  Rest - try to avoid heavy lifting and high impact activity Ice - apply for 20 minutes a few times daily Compression - buddy tape the toes as needed Elevation - prop up on a pillow  You can use ibuprofen (Advil) or tylenol for pain/swelling. Please follow up with orthopedics if symptoms persist.

## 2022-10-25 NOTE — ED Triage Notes (Signed)
Pt is here for right foot injury  x2days ago. Pt injured big toe on right foot 4 months ago. Foot causing pain and swelling.

## 2022-10-25 NOTE — ED Provider Notes (Signed)
[facility]    CSN: [id number] Arrival date & time: 10/25/22  7829     History   Chief Complaint Chief Complaint  Patient presents with   Foot Injury    [address] and appears I broke big toe. Need X-rays per virtual appointment recommendation. - Entered by patient    HPI Athol Bolds is a 36 y.o. male.  Presents with right toe pain Reports he stood up yesterday and fell Feels like his big toe is broken 9/10 pain  Tried Advil without much relief  He has been using foot brace and crutches  Did a virtual visit yesterday where he reported swelling and bruising. 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 No pertinent surgical history.     Home Medications    Prior to Admission medications   Not on File    Family History Family History  Problem Relation Age of Onset   Kidney disease Mother    Heart attack Father     Social History Social History   Tobacco Use   Smoking status: Every Day    Packs/day: 0.25    Years: 8.00    Total pack years: 2.00    Types: Cigarettes  Vaping Use   Vaping Use: Never used  Substance Use Topics   Alcohol use: Not Currently    Alcohol/week: 3.0 standard drinks of alcohol    Types: 3 Cans of beer per week    Comment: sober x6 yrs   Drug use: Not Currently    Types: Other-see comments    Comment: hx of opiate use, clean since Aug 2012     Allergies   Patient has no known allergies.   Review of Systems Review of Systems Per HPI  Physical Exam Triage Vital Signs ED Triage Vitals [10/25/22 0904]  Enc Vitals Group     BP 131/74     Pulse Rate 76     Resp 12     Temp 98 F (36.7 C)     Temp Source Oral     SpO2 98 %     Weight      Height       Head Circumference      Peak Flow      Pain Score      Pain Loc      Pain Edu?      Excl. in [place]?    No data found.  Updated Vital Signs BP 131/74 (BP Location: Right Arm)   Pulse 76   Temp 98 F (36.7 C) (Oral)   Resp 12   SpO2 98%    Physical Exam Vitals and nursing note reviewed.  Constitutional:      Appearance: Normal appearance.  Cardiovascular:     Rate and Rhythm: Normal rate and regular rhythm.  Pulmonary:     Effort: Pulmonary effort is normal.  Musculoskeletal:        General: Tenderness present. No swelling or deformity.     Comments: No obvious deformity, swelling, or bruising of the right foot. Distal great toe is tender to touch. Cap refill < 2 seconds, strong pedal pulse. Sensation intact. Patient ambulating with crutches.   Skin:    General:  Skin is warm and dry.     Capillary Refill: Capillary refill takes less than 2 seconds.     Findings: No bruising or erythema.  Neurological:     Mental Status: He is alert and oriented to person, place, and time.      UC Treatments / Results  Labs (all labs ordered are listed, but only abnormal results are displayed) Labs Reviewed - No data to display  EKG   Radiology DG Foot Complete Right  Result Date: 10/25/2022 CLINICAL DATA:  Great toe injury EXAM: RIGHT FOOT COMPLETE - 3 VIEW COMPARISON:  None Available. FINDINGS: No evidence of fracture or dislocation. Mild sclerosis at the base of the proximal phalanx of the great toe, likely sequela of remote prior injury. No evidence of arthropathy or other focal bone abnormality. Soft tissues are unremarkable. IMPRESSION: No acute osseous abnormality. Electronically Signed   By: Allegra Lai M.D.   On: 10/25/2022 09:34    Procedures Procedures (including critical care time)  Medications Ordered in UC Medications - No data to display  Initial Impression / Assessment and Plan / UC Course  I have reviewed the triage vital signs and the nursing  notes.  Pertinent labs & imaging results that were available during my care of the patient were reviewed by me and considered in my medical decision making (see chart for details).  Right foot xray negative Discussed possible soft tissue injury Recommend RICE therapy with ibu or tylenol Discussed he does not need the foot brace or the crutches at this time, but patient reports pain with weight bearing Recommend ortho follow up. Provided [facility] urgent care hours. Patient agrees to plan  Final Clinical Impressions(s) / UC Diagnoses   Final diagnoses:  Injury of right foot, initial encounter  Pain of right great toe     Discharge Instructions      Your xray was negative. You may have a soft tissue injury causing your discomfort.  Rest - try to avoid heavy lifting and high impact activity Ice - apply for 20 minutes a few times daily Compression - buddy tape the toes as needed Elevation - prop up on a pillow  You can use ibuprofen (Advil) or tylenol for pain/swelling. Please follow up with orthopedics if symptoms persist.    ED Prescriptions   None    PDMP not reviewed this encounter.   Burle Kwan, Lurena Joiner, PA-C 10/25/22 1027

## 2022-10-27 DIAGNOSIS — Z419 Encounter for procedure for purposes other than remedying health state, unspecified: Secondary | ICD-10-CM | POA: Diagnosis not present

## 2022-11-26 DIAGNOSIS — Z419 Encounter for procedure for purposes other than remedying health state, unspecified: Secondary | ICD-10-CM | POA: Diagnosis not present

## 2022-12-27 DIAGNOSIS — Z419 Encounter for procedure for purposes other than remedying health state, unspecified: Secondary | ICD-10-CM | POA: Diagnosis not present

## 2023-01-27 DIAGNOSIS — Z419 Encounter for procedure for purposes other than remedying health state, unspecified: Secondary | ICD-10-CM | POA: Diagnosis not present

## 2023-02-25 DIAGNOSIS — Z419 Encounter for procedure for purposes other than remedying health state, unspecified: Secondary | ICD-10-CM | POA: Diagnosis not present

## 2023-03-28 DIAGNOSIS — Z419 Encounter for procedure for purposes other than remedying health state, unspecified: Secondary | ICD-10-CM | POA: Diagnosis not present

## 2023-04-27 DIAGNOSIS — Z419 Encounter for procedure for purposes other than remedying health state, unspecified: Secondary | ICD-10-CM | POA: Diagnosis not present

## 2023-05-28 DIAGNOSIS — Z419 Encounter for procedure for purposes other than remedying health state, unspecified: Secondary | ICD-10-CM | POA: Diagnosis not present

## 2023-06-27 DIAGNOSIS — Z419 Encounter for procedure for purposes other than remedying health state, unspecified: Secondary | ICD-10-CM | POA: Diagnosis not present

## 2023-07-28 DIAGNOSIS — Z419 Encounter for procedure for purposes other than remedying health state, unspecified: Secondary | ICD-10-CM | POA: Diagnosis not present

## 2023-08-28 DIAGNOSIS — Z419 Encounter for procedure for purposes other than remedying health state, unspecified: Secondary | ICD-10-CM | POA: Diagnosis not present

## 2023-10-28 DIAGNOSIS — Z419 Encounter for procedure for purposes other than remedying health state, unspecified: Secondary | ICD-10-CM | POA: Diagnosis not present

## 2023-11-27 DIAGNOSIS — Z419 Encounter for procedure for purposes other than remedying health state, unspecified: Secondary | ICD-10-CM | POA: Diagnosis not present

## 2023-12-15 ENCOUNTER — Ambulatory Visit
Admission: RE | Admit: 2023-12-15 | Discharge: 2023-12-15 | Disposition: A | Discharge status: Home / Self Care | Payer: Managed Care, Other (non HMO) | Source: Ambulatory Visit | Attending: Internal Medicine | Admitting: Internal Medicine

## 2023-12-15 VITALS — BP 111/66 | HR 55 | Temp 98.5°F | Resp 16

## 2023-12-15 DIAGNOSIS — L02416 Cutaneous abscess of left lower limb: Secondary | ICD-10-CM

## 2023-12-15 MED ORDER — SULFAMETHOXAZOLE-TRIMETHOPRIM 800-160 MG PO TABS: 1.0000 | ORAL_TABLET | Freq: Two times a day (BID) | ORAL | 0 refills | Status: AC

## 2023-12-15 NOTE — Discharge Instructions (Signed)
Start Bactrim twice daily for 7 days.  Continue warm compresses as needed.  Follow-up with your PCP if your symptoms do not improve.  Please go to the ER if you develop any worsening symptoms.  I hope you feel better soon!

## 2023-12-15 NOTE — ED Provider Notes (Signed)
[facility]    CSN: [id number] Arrival date & time: 12/15/23  1036      History   Chief Complaint Chief Complaint  Patient presents with   Abscess    Woke up in middle of the night and noticed the top of my left thigh was hurting.  Noticed large red bump.  Now very swollen.  Hand sized area around it sore and swollen. - Entered by patient    HPI Tyler Graves is a 37 y.o. male presents for an abscess.  Patient states last night he woke with the melanite with some pain and tenderness to his left inner thigh.  States he felt a bump and took a sterilized needle and punctured it but states only got clear fluid.  Reports since then he feels like it is more tender and swollen.  No fevers or chills.  No history of MRSA or abscesses in the past.  He has been trying to do warm compresses as well.  No other concerns at this time.   Abscess   Past Medical History:  Diagnosis Date   Anxiety     Patient Active Problem List   Diagnosis Date Noted   Withdrawal seizures ([facility]) 07/20/2012   Polysubstance dependence ([facility]) 07/17/2012    Class: Chronic   Substance induced mood disorder ([facility]) 07/16/2012    History reviewed. No pertinent surgical history.     Home Medications    Prior to Admission medications   Medication Sig Start Date End Date Taking? Authorizing Provider  sulfamethoxazole-trimethoprim (BACTRIM DS) 800-160 MG tablet Take 1 tablet by mouth 2 (two) times daily for 7 days. 12/15/23 12/22/23 Yes Radford Pax, NP    Family History Family History  Problem Relation Age of Onset   Kidney disease Mother    Heart attack Father     Social History Social History   Tobacco Use   Smoking status: Every Day    Current packs/day: 0.25    Average packs/day: 0.3 packs/day for 8.0 years (2.0 ttl pk-yrs)    Types: Cigarettes  Vaping Use   Vaping status: Never Used  Substance Use Topics   Alcohol use: Not Currently    Alcohol/week: 3.0 standard drinks of  alcohol    Types: 3 Cans of beer per week    Comment: sober x6 yrs   Drug use: Not Currently    Types: Other-see comments    Comment: hx of opiate use, clean since Aug 2012     Allergies   Patient has no known allergies.   Review of Systems Review of Systems  Skin:        Left thigh abscess     Physical Exam Triage Vital Signs ED Triage Vitals  Encounter Vitals Group     BP 12/15/23 1045 111/66     Systolic BP Percentile --      Diastolic BP Percentile --      Pulse Rate 12/15/23 1045 (!) 55     Resp 12/15/23 1045 16     Temp 12/15/23 1045 98.5 F (36.9 C)     Temp Source 12/15/23 1045 Oral     SpO2 12/15/23 1045 96 %     Weight --      Height --      Head Circumference --      Peak Flow --      Pain Score 12/15/23 1039 5     Pain Loc --      Pain Education --  Exclude from Growth Chart --    No data found.  Updated Vital Signs BP 111/66 (BP Location: Left Arm)   Pulse (!) 55   Temp 98.5 F (36.9 C) (Oral)   Resp 16   SpO2 96%   Visual Acuity Right Eye Distance:   Left Eye Distance:   Bilateral Distance:    Right Eye Near:   Left Eye Near:    Bilateral Near:     Physical Exam Vitals and nursing note reviewed. 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  Constitutional:      General: He is not in acute distress.    Appearance: Normal appearance. He is not ill-appearing.  HENT:     Head: Normocephalic and atraumatic.  Eyes:     Pupils: Pupils are equal, round, and reactive to light.  Cardiovascular:     Rate and Rhythm: Normal rate.  Pulmonary:     Effort: Pulmonary effort is normal.  Genitourinary:      Comments: There is a 1 x 1-1/2 indurated nonfluctuant abscess to the left inner thigh.  No surrounding erythema or swelling or warmth.  Mildly tender to palpation. Skin:    General: Skin is warm and dry.  Neurological:     General: No focal deficit present.     Mental Status: He is alert and oriented to person, place, and  time.  Psychiatric:        Mood and Affect: Mood normal.        Behavior: Behavior normal.      UC Treatments / Results  Labs (all labs ordered are listed, but only abnormal results are displayed) Labs Reviewed - No data to display  EKG   Radiology No results found.  Procedures Procedures (including critical care time)  Medications Ordered in UC Medications - No data to display  Initial Impression / Assessment and Plan / UC Course  I have reviewed the triage vital signs and the nursing notes.  Pertinent labs & imaging results that were available during my care of the patient were reviewed by me and considered in my medical decision making (see chart for details).     Reviewed exam and symptoms with patient.  No red flags.  No indication for I&D on exam.  Will start Bactrim advised to continue warm compresses.  Instructed not to attempt to drain or squeeze it himself.  PCP follow-up if symptoms do not improve.  ER precautions reviewed. Final Clinical Impressions(s) / UC Diagnoses   Final diagnoses:  Abscess of left thigh     Discharge Instructions      Start Bactrim twice daily for 7 days.  Continue warm compresses as needed.  Follow-up with your PCP if your symptoms do not improve.  Please go to the ER if you develop any worsening symptoms.  I hope you feel better soon!    ED Prescriptions     Medication Sig Dispense Auth. Provider   sulfamethoxazole-trimethoprim (BACTRIM DS) 800-160 MG tablet Take 1 tablet by mouth 2 (two) times daily for 7 days. 14 tablet Radford Pax, NP      PDMP not reviewed this encounter.   Radford Pax, NP 12/15/23 [phone]

## 2023-12-15 NOTE — ED Triage Notes (Signed)
Pt presents to UC for c/o bump on left thigh near scrotum. Pt states he felt it at 2am this morning and lanced it with a sterile needle, but no pus came out, only clear liquid. Pt reports he tried to pop it but was unable. He cleaned it with rubbing alcohol. Pt states as time went on, the bump swelled greater. He denies drainage since lancing it and has been using a hot rag to try to help drain it.

## 2023-12-28 DIAGNOSIS — Z419 Encounter for procedure for purposes other than remedying health state, unspecified: Secondary | ICD-10-CM | POA: Diagnosis not present

## 2024-01-28 DIAGNOSIS — Z419 Encounter for procedure for purposes other than remedying health state, unspecified: Secondary | ICD-10-CM | POA: Diagnosis not present

## 2024-02-25 DIAGNOSIS — Z419 Encounter for procedure for purposes other than remedying health state, unspecified: Secondary | ICD-10-CM | POA: Diagnosis not present

## 2024-04-07 DIAGNOSIS — Z419 Encounter for procedure for purposes other than remedying health state, unspecified: Secondary | ICD-10-CM | POA: Diagnosis not present

## 2024-08-18 ENCOUNTER — Encounter (HOSPITAL_COMMUNITY): Payer: Self-pay

## 2024-08-18 ENCOUNTER — Emergency Department (HOSPITAL_COMMUNITY)
Admission: EM | Admit: 2024-08-18 | Discharge: 2024-08-18 | Disposition: A | Discharge status: Home / Self Care | Attending: Emergency Medicine | Admitting: Emergency Medicine

## 2024-08-18 ENCOUNTER — Other Ambulatory Visit: Payer: Self-pay

## 2024-08-18 DIAGNOSIS — T402X1A Poisoning by other opioids, accidental (unintentional), initial encounter: Secondary | ICD-10-CM | POA: Insufficient documentation

## 2024-08-18 DIAGNOSIS — T40601A Poisoning by unspecified narcotics, accidental (unintentional), initial encounter: Secondary | ICD-10-CM
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
## 2024-08-18 NOTE — ED Notes (Signed)
 ED Provider at bedside.

## 2024-08-18 NOTE — ED Notes (Signed)
 Pt discharged. Pt given discharge papers and papers explained. Pt in NAD at this time
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
## 2024-08-18 NOTE — ED Notes (Signed)
 Patient is resting comfortably.

## 2025-02-01 ENCOUNTER — Other Ambulatory Visit: Payer: Self-pay

## 2025-02-01 ENCOUNTER — Emergency Department (HOSPITAL_COMMUNITY)

## 2025-02-01 ENCOUNTER — Encounter (HOSPITAL_COMMUNITY): Payer: Self-pay

## 2025-02-01 ENCOUNTER — Inpatient Hospital Stay (HOSPITAL_COMMUNITY)

## 2025-02-01 ENCOUNTER — Inpatient Hospital Stay (HOSPITAL_COMMUNITY): Admission: EM | Admit: 2025-02-01 | Source: Home / Self Care | Admitting: Internal Medicine

## 2025-02-01 DIAGNOSIS — W19XXXA Unspecified fall, initial encounter: Principal | ICD-10-CM

## 2025-02-01 DIAGNOSIS — R569 Unspecified convulsions: Secondary | ICD-10-CM

## 2025-02-01 HISTORY — DX: Opioid abuse, uncomplicated: F11.10

## 2025-02-01 LAB — I-STAT CHEM 8, ED
BUN: 19 mg/dL (ref 6–20)
Calcium, Ion: 1.23 mmol/L (ref 1.15–1.40)
Chloride: 104 mmol/L (ref 98–111)
Creatinine, Ser: 0.9 mg/dL (ref 0.61–1.24)
Glucose, Bld: 123 mg/dL — ABNORMAL HIGH (ref 70–99)
HCT: 49 % (ref 39.0–52.0)
Hemoglobin: 16.7 g/dL (ref 13.0–17.0)
Potassium: 3.9 mmol/L (ref 3.5–5.1)
Sodium: 141 mmol/L (ref 135–145)
TCO2: 22 mmol/L (ref 22–32)

## 2025-02-01 LAB — URINALYSIS, ROUTINE W REFLEX MICROSCOPIC
Bilirubin Urine: NEGATIVE
Glucose, UA: NEGATIVE mg/dL
Ketones, ur: 5 mg/dL — AB
Nitrite: NEGATIVE
Protein, ur: 100 mg/dL — AB
Specific Gravity, Urine: 1.023 (ref 1.005–1.030)
pH: 5 (ref 5.0–8.0)

## 2025-02-01 LAB — CBC
HCT: 46.4 % (ref 39.0–52.0)
Hemoglobin: 16.6 g/dL (ref 13.0–17.0)
MCH: 30.2 pg (ref 26.0–34.0)
MCHC: 35.8 g/dL (ref 30.0–36.0)
MCV: 84.4 fL (ref 80.0–100.0)
Platelets: 302 10*3/uL (ref 150–400)
RBC: 5.5 MIL/uL (ref 4.22–5.81)
RDW: 12.4 % (ref 11.5–15.5)
WBC: 10.9 10*3/uL — ABNORMAL HIGH (ref 4.0–10.5)
nRBC: 0 % (ref 0.0–0.2)

## 2025-02-01 LAB — GAMMA GT: GGT: 19 U/L (ref 7–50)

## 2025-02-01 LAB — COMPREHENSIVE METABOLIC PANEL WITH GFR
ALT: 23 U/L (ref 0–44)
AST: 34 U/L (ref 15–41)
Albumin: 5 g/dL (ref 3.5–5.0)
Alkaline Phosphatase: 91 U/L (ref 38–126)
Anion gap: 12 (ref 5–15)
BUN: 17 mg/dL (ref 6–20)
CO2: 23 mmol/L (ref 22–32)
Calcium: 8.6 mg/dL — ABNORMAL LOW (ref 8.9–10.3)
Chloride: 102 mmol/L (ref 98–111)
Creatinine, Ser: 0.93 mg/dL (ref 0.61–1.24)
GFR, Estimated: >60 mL/min
Glucose, Bld: 122 mg/dL — ABNORMAL HIGH (ref 70–99)
Potassium: 3.9 mmol/L (ref 3.5–5.1)
Sodium: 138 mmol/L (ref 135–145)
Total Bilirubin: 0.5 mg/dL (ref 0.0–1.2)
Total Protein: 8.5 g/dL — ABNORMAL HIGH (ref 6.5–8.1)

## 2025-02-01 LAB — ACETAMINOPHEN LEVEL: Acetaminophen (Tylenol), Serum: <10 ug/mL — ABNORMAL LOW (ref 10–30)

## 2025-02-01 LAB — SAMPLE TO BLOOD BANK

## 2025-02-01 LAB — I-STAT CG4 LACTIC ACID, ED
Lactic Acid, Venous: 2 mmol/L (ref 0.5–1.9)
Lactic Acid, Venous: 1.4 mmol/L (ref 0.5–1.9)

## 2025-02-01 LAB — HIV ANTIBODY (ROUTINE TESTING W REFLEX): HIV Screen 4th Generation wRfx: NONREACTIVE

## 2025-02-01 LAB — URINE DRUG SCREEN
Amphetamines: NEGATIVE
Barbiturates: NEGATIVE
Benzodiazepines: POSITIVE — AB
Cocaine: NEGATIVE
Fentanyl: POSITIVE — AB
Methadone Scn, Ur: POSITIVE — AB
Opiates: POSITIVE — AB
Tetrahydrocannabinol: NEGATIVE

## 2025-02-01 LAB — ETHANOL: Alcohol, Ethyl (B): <15 mg/dL

## 2025-02-01 LAB — CK: Total CK: 1370 U/L — ABNORMAL HIGH (ref 49–397)

## 2025-02-01 LAB — PROTIME-INR
INR: 1 (ref 0.8–1.2)
Prothrombin Time: 14.1 s (ref 11.4–15.2)

## 2025-02-01 MED ORDER — THIAMINE MONONITRATE 100 MG PO TABS: 100.0000 mg | ORAL_TABLET | Freq: Every day | ORAL | Status: AC

## 2025-02-01 MED ORDER — LORAZEPAM 1 MG PO TABS
0.0000 mg | ORAL_TABLET | Freq: Four times a day (QID) | ORAL | Status: AC
  Administered 2025-02-01: 1 mg via ORAL
  Filled 2025-02-01: qty 1

## 2025-02-01 MED ORDER — POLYETHYLENE GLYCOL 3350 17 G PO PACK: 17.0000 g | PACK | Freq: Every day | ORAL | Status: AC | PRN

## 2025-02-01 MED ORDER — LORAZEPAM 1 MG PO TABS: 0.0000 mg | ORAL_TABLET | Freq: Two times a day (BID) | ORAL | Status: AC

## 2025-02-01 MED ORDER — LEVETIRACETAM (KEPPRA) 500 MG/5 ML ADULT IV PUSH
1500.0000 mg | Freq: Once | INTRAVENOUS | Status: AC
  Filled 2025-02-01: qty 15

## 2025-02-01 MED ORDER — LORAZEPAM 2 MG/ML IJ SOLN
4.0000 mg | INTRAMUSCULAR | Status: AC | PRN
  Filled 2025-02-01: qty 2

## 2025-02-01 MED ORDER — SODIUM CHLORIDE 0.9 % IV BOLUS: 1000.0000 mL | Freq: Once | INTRAVENOUS | Status: AC

## 2025-02-01 MED ORDER — ACETAMINOPHEN 650 MG RE SUPP: 650.0000 mg | RECTAL | Status: AC | PRN

## 2025-02-01 MED ORDER — SODIUM CHLORIDE 0.9 % IV SOLN
75.0000 mL/h | INTRAVENOUS | Status: AC
  Administered 2025-02-01: 75 mL/h via INTRAVENOUS

## 2025-02-01 MED ORDER — ONDANSETRON HCL 4 MG/2ML IJ SOLN
4.0000 mg | Freq: Once | INTRAMUSCULAR | Status: AC
  Administered 2025-02-01: 4 mg via INTRAVENOUS
  Filled 2025-02-01: qty 2

## 2025-02-01 MED ORDER — THIAMINE HCL 100 MG/ML IJ SOLN
100.0000 mg | Freq: Every day | INTRAMUSCULAR | Status: AC
  Administered 2025-02-01: 100 mg via INTRAVENOUS
  Filled 2025-02-01: qty 2

## 2025-02-01 MED ORDER — SODIUM CHLORIDE 0.9 % IV BOLUS
1000.0000 mL | Freq: Once | INTRAVENOUS | Status: AC
  Administered 2025-02-01: 1000 mL via INTRAVENOUS

## 2025-02-01 MED ORDER — METHADONE HCL 10 MG PO TABS
70.0000 mg | ORAL_TABLET | Freq: Once | ORAL | Status: AC
  Administered 2025-02-01: 70 mg via ORAL
  Filled 2025-02-01: qty 7

## 2025-02-01 MED ORDER — LORAZEPAM 2 MG/ML IJ SOLN
0.0000 mg | Freq: Four times a day (QID) | INTRAMUSCULAR | Status: AC
  Administered 2025-02-01: 1 mg via INTRAVENOUS
  Filled 2025-02-01: qty 1

## 2025-02-01 MED ORDER — ACETAMINOPHEN 325 MG PO TABS
650.0000 mg | ORAL_TABLET | ORAL | Status: AC | PRN
  Administered 2025-02-01: 650 mg via ORAL
  Filled 2025-02-01: qty 2

## 2025-02-01 MED ORDER — LORAZEPAM 2 MG/ML IJ SOLN: 0.0000 mg | Freq: Two times a day (BID) | INTRAMUSCULAR | Status: AC

## 2025-02-01 MED ORDER — ORAL CARE MOUTH RINSE: 15.0000 mL | OROMUCOSAL | Status: AC | PRN

## 2025-02-01 NOTE — H&P (Signed)
 " History and Physical    Patient: Tyler Graves FMW:[id number] DOB: 18-Dec-1986 DOA: 02/01/2025 DOS: the patient was seen and examined on 02/01/2025 . PCP: Pcp, No  Patient coming from: Home Chief complaint: Chief Complaint  Patient presents with   Drug Overdose   Seizures   HPI:  Tyler Graves is a 39 y.o. male with past medical history  of polysubstance abuse, tobacco use, alcohol abuse, history of drug overdose and withdrawal seizures, also history of suicide attempt brought today for seizures.  Per initial nursing note review reports using fentanyl methadone  and Xanax and he was given Narcan after the seizure.  Per initial report patient had not been using any drugs for the past few days he was taking a shower and the next thing he knows was he woke up in the back of an ambulance.  ED Course:  Vital signs in the ED were notable for the following:  Vitals:   02/01/25 1240 02/01/25 1400 02/01/25 1515 02/01/25 1537  BP: (!) 143/93 133/88 125/84   Pulse: 67 70 76   Temp:    97.9 F (36.6 C)  Resp:      Height:      Weight:      SpO2:      TempSrc:    Oral  BMI (Calculated):       >>ED evaluation thus far shows: Initial seen and patient's glucose 122, normal creatinine 0.93 eGFR more than 60 total protein 8.5. Lactic acid of 2.0. CBC shows white count of 10.9 hemoglobin of 16.6 platelets of 302. Acetaminophen  level added on. UDS pos for :  02/01/25 11:48  Methadone  Scn, Ur POSITIVE !  Amphetamines NEGATIVE  Barbiturates NEGATIVE  Benzodiazepines POSITIVE !  Opiates POSITIVE !  COCAINE NEGATIVE  Tetrahydrocannabinol NEGATIVE  Fentanyl POSITIVE !    >>While in the ED patient received the following: Medications  sodium chloride  0.9 % bolus 1,000 mL (1,000 mLs Intravenous Not Given 02/01/25 1232)  levETIRAcetam  (KEPPRA ) undiluted injection 1,500 mg (0 mg Intravenous Hold 02/01/25 1231)  LORazepam  (ATIVAN ) injection 0-4 mg (1 mg Intravenous Given 02/01/25 1245)    Or   LORazepam  (ATIVAN ) tablet 0-4 mg ( Oral See Alternative 02/01/25 1245)  LORazepam  (ATIVAN ) injection 0-4 mg (has no administration in time range)    Or  LORazepam  (ATIVAN ) tablet 0-4 mg (has no administration in time range)  thiamine  (VITAMIN B1) tablet 100 mg ( Oral See Alternative 02/01/25 1238)    Or  thiamine  (VITAMIN B1) injection 100 mg (100 mg Intravenous Given 02/01/25 1238)  sodium chloride  0.9 % bolus 1,000 mL (1,000 mLs Intravenous New Bag/Given 02/01/25 1224)  ondansetron  (ZOFRAN ) injection 4 mg (4 mg Intravenous Given 02/01/25 1219)  methadone  (DOLOPHINE ) tablet 70 mg (70 mg Oral Given 02/01/25 1225)   Review of Systems  Neurological:  Positive for seizures and loss of consciousness.   Past Medical History:  Diagnosis Date   Anxiety    Mild fentanyl abuse (HCC)    severe   History reviewed. No pertinent surgical history.  reports that he has been smoking cigarettes. He has a 2 pack-year smoking history. He does not have any smokeless tobacco history on file. He reports that he does not currently use alcohol after a past usage of about 3.0 standard drinks of alcohol per week. He reports current drug use. Drug: Other-see comments. Allergies[1] Family History  Problem Relation Age of Onset   Kidney disease Mother    Heart attack Father    Prior to  Admission medications  Medication Sig Start Date End Date Taking? Authorizing Provider  methadone  (DOLOPHINE ) 10 MG/ML solution Take 150 mg by mouth daily.   Yes [provider]                                                                                 Vitals:   02/01/25 1240 02/01/25 1400 02/01/25 1515 02/01/25 1537  BP: (!) 143/93 133/88 125/84   Pulse: 67 70 76   Resp:      Temp:    97.9 F (36.6 C)  TempSrc:    Oral  SpO2:      Weight:      Height:       Physical Exam Vitals reviewed.  Constitutional:      General: He is not in acute distress.    Appearance: He is not ill-appearing.  HENT:     Head:  Normocephalic.  Eyes:     Extraocular Movements: Extraocular movements intact.  Cardiovascular:     Rate and Rhythm: Normal rate and regular rhythm.     Pulses: Normal pulses.     Heart sounds: Normal heart sounds.  Pulmonary:     Breath sounds: Normal breath sounds.  Abdominal:     General: There is no distension.     Palpations: Abdomen is soft.     Tenderness: There is no abdominal tenderness.  Musculoskeletal:     Right lower leg: No edema.     Left lower leg: No edema.  Neurological:     General: No focal deficit present.     Mental Status: He is alert and oriented to person, place, and time.     Labs on Admission: I have personally reviewed following labs and imaging studies CBC: Recent Labs  Lab 02/01/25 1133 02/01/25 1140  WBC 10.9*  --   HGB 16.6 16.7  HCT 46.4 49.0  MCV 84.4  --   PLT 302  --    Basic Metabolic Panel: Recent Labs  Lab 02/01/25 1133 02/01/25 1140  NA 138 141  K 3.9 3.9  CL 102 104  CO2 23  --   GLUCOSE 122* 123*  BUN 17 19  CREATININE 0.93 0.90  CALCIUM 8.6*  --    GFR: Estimated Creatinine Clearance: 116.4 mL/min (by C-G formula based on SCr of 0.9 mg/dL). Liver Function Tests: Recent Labs  Lab 02/01/25 1133  AST 34  ALT 23  ALKPHOS 91  BILITOT 0.5  PROT 8.5*  ALBUMIN 5.0   No results for input(s): LIPASE, AMYLASE in the last 168 hours. No results for input(s): AMMONIA in the last 168 hours. Recent Labs    02/01/25 1133 02/01/25 1140  BUN 17 19  CREATININE 0.93 0.90  Urine analysis:    Component Value Date/Time   COLORURINE YELLOW 02/01/2025 1148   APPEARANCEUR TURBID (A) 02/01/2025 1148   LABSPEC 1.023 02/01/2025 1148   PHURINE 5.0 02/01/2025 1148   GLUCOSEU NEGATIVE 02/01/2025 1148   HGBUR SMALL (A) 02/01/2025 1148   BILIRUBINUR NEGATIVE 02/01/2025 1148   KETONESUR 5 (A) 02/01/2025 1148   PROTEINUR 100 (A) 02/01/2025 1148   NITRITE NEGATIVE 02/01/2025 1148   LEUKOCYTESUR TRACE (A)  02/01/2025 1148    Radiological Exams on Admission: CT HEAD WO CONTRAST Result Date: 02/01/2025 EXAM: CT HEAD, FACIAL BONES AND CERVICAL SPINE WITHOUT CONTRAST 02/01/2025 12:05:27 PM TECHNIQUE: CT of the head, facial bones and cervical spine was performed without the administration of intravenous contrast. Multiplanar reformatted images are provided for review. Automated exposure control, iterative reconstruction, and/or weight based adjustment of the mA/kV was utilized to reduce the radiation dose to as low as reasonably achievable. COMPARISON: None available. CLINICAL HISTORY: Head trauma, moderate-severe Head trauma, moderate-severe. FINDINGS: CT HEAD BRAIN AND VENTRICLES: No acute intracranial hemorrhage. No mass effect or midline shift. No extra-axial fluid collection. No evidence of acute infarct. No hydrocephalus. SKULL AND SCALP: No acute skull fracture. No scalp hematoma. CT FACIAL BONES FACIAL BONES: No acute facial fracture. No mandibular dislocation. No suspicious bone lesion. ORBITS: No acute traumatic injury. SINUSES AND MASTOIDS: Left greater than right maxillary sinus mucosal thickening. SOFT TISSUES: Large right periorbital/forehead contusion. CT CERVICAL SPINE BONES AND ALIGNMENT: No acute fracture or traumatic malalignment. DEGENERATIVE CHANGES: No significant degenerative changes. SOFT TISSUES: Large right periorbital/forehead contusion. IMPRESSION: 1. No acute intracranial abnormality. 2. No acute fracture or traumatic malalignment of the cervical spine. 3. No acute fracture of the facial bones. 4. Large right periorbital/forehead contusion. Electronically signed by: Glendia Molt MD 02/01/2025 12:30 PM EST RP Workstation: [id number]   CT MAXILLOFACIAL WO CONTRAST Result Date: 02/01/2025 EXAM: CT HEAD, FACIAL BONES AND CERVICAL SPINE WITHOUT CONTRAST 02/01/2025 12:05:27 PM TECHNIQUE: CT of the head, facial bones and cervical spine was performed without the administration of intravenous contrast. Multiplanar  reformatted images are provided for review. Automated exposure control, iterative reconstruction, and/or weight based adjustment of the mA/kV was utilized to reduce the radiation dose to as low as reasonably achievable. COMPARISON: None available. CLINICAL HISTORY: Head trauma, moderate-severe Head trauma, moderate-severe. FINDINGS: CT HEAD BRAIN AND VENTRICLES: No acute intracranial hemorrhage. No mass effect or midline shift. No extra-axial fluid collection. No evidence of acute infarct. No hydrocephalus. SKULL AND SCALP: No acute skull fracture. No scalp hematoma. CT FACIAL BONES FACIAL BONES: No acute facial fracture. No mandibular dislocation. No suspicious bone lesion. ORBITS: No acute traumatic injury. SINUSES AND MASTOIDS: Left greater than right maxillary sinus mucosal thickening. SOFT TISSUES: Large right periorbital/forehead contusion. CT CERVICAL SPINE BONES AND ALIGNMENT: No acute fracture or traumatic malalignment. DEGENERATIVE CHANGES: No significant degenerative changes. SOFT TISSUES: Large right periorbital/forehead contusion. IMPRESSION: 1. No acute intracranial abnormality. 2. No acute fracture or traumatic malalignment of the cervical spine. 3. No acute fracture of the facial bones. 4. Large right periorbital/forehead contusion. Electronically signed by: Glendia Molt MD 02/01/2025 12:30 PM EST RP Workstation: [id number]   CT CERVICAL SPINE WO CONTRAST Result Date: 02/01/2025 EXAM: CT HEAD, FACIAL BONES AND CERVICAL SPINE WITHOUT CONTRAST 02/01/2025 12:05:27 PM TECHNIQUE: CT of the head, facial bones and cervical spine was performed without the administration of intravenous contrast. Multiplanar reformatted images are provided for review. Automated exposure control, iterative reconstruction, and/or weight based adjustment of the mA/kV was utilized to reduce the radiation dose to as low as reasonably achievable. COMPARISON: None available. CLINICAL HISTORY: Head trauma, moderate-severe Head  trauma, moderate-severe. FINDINGS: CT HEAD BRAIN AND VENTRICLES: No acute intracranial hemorrhage. No mass effect or midline shift. No extra-axial fluid collection. No evidence of acute infarct. No hydrocephalus. SKULL AND SCALP: No acute skull fracture. No scalp hematoma. CT FACIAL BONES FACIAL BONES: No acute facial fracture. No mandibular dislocation. No suspicious bone lesion. ORBITS: No acute traumatic  injury. SINUSES AND MASTOIDS: Left greater than right maxillary sinus mucosal thickening. SOFT TISSUES: Large right periorbital/forehead contusion. CT CERVICAL SPINE BONES AND ALIGNMENT: No acute fracture or traumatic malalignment. DEGENERATIVE CHANGES: No significant degenerative changes. SOFT TISSUES: Large right periorbital/forehead contusion. IMPRESSION: 1. No acute intracranial abnormality. 2. No acute fracture or traumatic malalignment of the cervical spine. 3. No acute fracture of the facial bones. 4. Large right periorbital/forehead contusion. Electronically signed by: Glendia Molt MD 02/01/2025 12:30 PM EST RP Workstation: [id number]   DG Pelvis Portable Result Date: 02/01/2025 CLINICAL DATA:  Status post trauma. EXAM: PORTABLE PELVIS 1-2 VIEWS COMPARISON:  None Available. FINDINGS: There is no evidence of pelvic fracture or diastasis. No pelvic bone lesions are seen. IMPRESSION: Negative. Electronically Signed   By: Suzen Dials M.D.   On: 02/01/2025 12:27   DG Chest Port 1 View Result Date: 02/01/2025 CLINICAL DATA:  Status post trauma. EXAM: PORTABLE CHEST 1 VIEW COMPARISON:  August 09, 2014 FINDINGS: The heart size and mediastinal contours are within normal limits. Both lungs are clear. The visualized skeletal structures are unremarkable. IMPRESSION: No active disease. Electronically Signed   By: Suzen Dials M.D.   On: 02/01/2025 12:25   Data Reviewed: Relevant notes from primary care and specialist visits, past discharge summaries as available in EHR, including Care Everywhere .  Prior diagnostic testing as pertinent to current admission diagnoses, Updated medications and problem lists for reconciliation .ED course, including vitals, labs, imaging, treatment and response to treatment,Triage notes, nursing and pharmacy notes and ED provider's notes.Notable results as noted in HPI.Discussed case with EDMD/ ED APP/ or Specialty MD on call and as needed.  Assessment & Plan    >>Seizure: Attributed to withdrawal seizure related to patient's use fentanyl and benzodiazepines and patient states he has been off of his Xanax for 4 to 5 days prior. Curbside neurology and patient's appropriately given keppra  and there is no indication for ongoing AED since this is particularly related to substance abuse and withdrawal. No mandated driving restriction currently. D/W pt that it is imperative he follow with resources to help him to avoid future seizures and related complication, in addition to losing driving privileges.  Continue with seizure precautions CIWA protocol, neurochecks. Discussed with patient about refraining from using THC as it has been associated with seizures as well.   >>Syncope collapse: Likely postictal state following witnessed seizure . CT head/c-spine negative for acute intracranial pathology or traumatic injury. Large right periorbital/forehead contusion consistent with fall during seizure.    >>Polysubstance abuse: CIWA , ggt, thiamine , nicotine  patch.  CM consult for resource for same and outpatient psych consult.  Will continue patient's methadone  at 150 mg/day which is his home dose he did receive 70 mg in the emergency room. GGT ordered and pending.   >>H/O depression/ H/O SI and SA: Discussed with case manager to offer resources for patient inpatient and outpatient for substance abuse and counseling.    DVT prophylaxis:  Scd's  Consults:  None.   Advance Care Planning:    Code Status: Full Code   Family Communication:  None Disposition  Plan:  TBD.  Severity of Illness: The appropriate patient status for this patient is INPATIENT. Inpatient status is judged to be reasonable and necessary in order to provide the required intensity of service to ensure the patient's safety. The patient's presenting symptoms, physical exam findings, and initial radiographic and laboratory data in the context of their chronic comorbidities is felt to place them at high risk  for further clinical deterioration. Furthermore, it is not anticipated that the patient will be medically stable for discharge from the hospital within 2 midnights of admission.   * I certify that at the point of admission it is my clinical judgment that the patient will require inpatient hospital care spanning beyond 2 midnights from the point of admission due to high intensity of service, high risk for further deterioration and high frequency of surveillance required.*  Unresulted Labs (From admission, onward)     Start     Ordered   02/02/25 0500  Comprehensive metabolic panel  Tomorrow morning,   R        02/01/25 1510   02/02/25 0500  CBC  Tomorrow morning,   R        02/01/25 1510   02/01/25 1509  HIV Antibody (routine testing w rflx)  (HIV Antibody (Routine testing w reflex) panel)  Once,   R        02/01/25 1510   02/01/25 1434  Gamma GT  Add-on,   AD        02/01/25 1433   02/01/25 1432  Acetaminophen  level  Add-on,   AD        02/01/25 1431   02/01/25 1429  CK  Add-on,   AD        02/01/25 1428            Meds ordered this encounter  Medications   sodium chloride  0.9 % bolus 1,000 mL   sodium chloride  0.9 % bolus 1,000 mL   ondansetron  (ZOFRAN ) injection 4 mg   methadone  (DOLOPHINE ) tablet 70 mg    Refill:  0   levETIRAcetam  (KEPPRA ) undiluted injection 1,500 mg   OR Linked Order Group    LORazepam  (ATIVAN ) injection 0-4 mg     CIWA-AR < 5 =:   0 mg     CIWA-AR 5 -10 =:   1 mg     CIWA-AR 11 -15 =:   2 mg     CIWA-AR 16 -20 =:   3 mg     CIWA-AR  16 -20 =:   Recheck CIWA-AR in 1 hour; if > 20 notify MD     CIWA-AR > 20 =:   4 mg     CIWA-AR > 20 =:   Notify MD    LORazepam  (ATIVAN ) tablet 0-4 mg     CIWA-AR < 5 =:   0 mg     CIWA-AR 5 -10 =:   1 mg     CIWA-AR 11 -15 =:   2 mg     CIWA-AR 16 -20 =:   3 mg     CIWA-AR 16 -20 =:   Recheck CIWA-AR in 1 hour; if > 20 notify MD     CIWA-AR > 20 =:   4 mg     CIWA-AR > 20 =:   Notify MD   OR Linked Order Group    LORazepam  (ATIVAN ) injection 0-4 mg     CIWA-AR < 5 =:   0 mg     CIWA-AR 5 -10 =:   1 mg     CIWA-AR 11 -15 =:   2 mg     CIWA-AR 16 -20 =:   3 mg     CIWA-AR 16 -20 =:   Recheck CIWA-AR in 1 hour; if > 20 notify MD     CIWA-AR > 20 =:   4 mg     CIWA-AR >  20 =:   Notify MD    LORazepam  (ATIVAN ) tablet 0-4 mg     CIWA-AR < 5 =:   0 mg     CIWA-AR 5 -10 =:   1 mg     CIWA-AR 11 -15 =:   2 mg     CIWA-AR 16 -20 =:   3 mg     CIWA-AR 16 -20 =:   Recheck CIWA-AR in 1 hour; if > 20 notify MD     CIWA-AR > 20 =:   4 mg     CIWA-AR > 20 =:   Notify MD   OR Linked Order Group    thiamine  (VITAMIN B1) tablet 100 mg    thiamine  (VITAMIN B1) injection 100 mg   Oral care mouth rinse   0.9 %  sodium chloride  infusion   LORazepam  (ATIVAN ) injection 4 mg   OR Linked Order Group    acetaminophen  (TYLENOL ) tablet 650 mg    acetaminophen  (TYLENOL ) suppository 650 mg   polyethylene glycol (MIRALAX  / GLYCOLAX ) packet 17 g     Orders Placed This Encounter  Procedures   DG Chest Port 1 View   DG Pelvis Portable   CT HEAD WO CONTRAST   CT MAXILLOFACIAL WO CONTRAST   CT CERVICAL SPINE WO CONTRAST   Comprehensive metabolic panel   CBC   Ethanol   Urinalysis, Routine w reflex microscopic -Urine, Clean Catch   Protime-INR   Urine rapid drug screen (hosp performed)   CK   Acetaminophen  level   Gamma GT   HIV Antibody (routine testing w rflx)   Comprehensive metabolic panel   CBC   Diet regular Room service appropriate? Yes; Fluid consistency: Thin   ED Cardiac monitoring    Measure blood pressure   Initiate Carrier Fluid Protocol   Clinical institute withdrawal assessment every 6 hours X 48 hours, then every 12 hours x 48 hours   Notify MD if CIWA-AR is greater than 10 for 2 consecutive assessments.   Notify MD if CIWA-AR score > 10 point increase (consider critical care consult).   Notify MD if patient has a seizure.   May administer Ativan  PO versus IV if patient is tolerating PO intake well   Vital signs every 6 hours X 48 hours, then per unit protocol   Refer to Sidebar Report CIWA protocol   If Ativan  given, reassess Clinical Institute Withdrawal Assessment (CIWA) with blood pressure and pulse rate within 1 hour of Ativan  administration   Clinical opiate withdrawal score   Irrigate wound   Ice to affected area   Apply steri strips   Vital signs   Neuro checks   Cardiac monitoring   Activity as tolerated   Notify physician (specify)   Maintain IV access   Intake and output   Apply Seizure Disorder Care Plan   Do not place and if present remove PureWick   Initiate Oral Care Protocol   Initiate Carrier Fluid Protocol   Brush teeth with suction toothbrush 4 times daily   Clinical institute withdrawal assessment   Full code   Consult for Unassigned Medical Admission   Consult to neurology   ED Pulse oximetry, continuous   End-tidal CO2   I-Stat Chem 8, ED   I-Stat Lactic Acid, ED   I-Stat CG4 Lactic Acid   EKG 12-Lead   Sample to Blood Bank   EEG adult now   Saline lock IV   Admit to Inpatient (patient's expected length of stay  will be greater than 2 midnights or inpatient only procedure)   Seizure precautions   Seizure precautions   Aspiration precautions    Author: Mario LULLA Blanch, MD 12 pm- 8 pm. [facility]. 02/01/2025 3:38 PM Please note for any communication after hours contact [facility] Assigned provider on call on Amion.        [1] No Known Allergies  "

## 2025-02-01 NOTE — Progress Notes (Signed)
 Responded to page to support pt. That came in as Drug over dose.  Brother and Dad at bedside.  Chaplain provided emotional and spiritual support.  Chaplain available as needed.  Rayleen Dade, [place], BC C, Pager [phone]

## 2025-02-01 NOTE — ED Notes (Signed)
 CCMD called by this RN

## 2025-02-01 NOTE — ED Triage Notes (Signed)
 Patient had an OD family gave narcan at home.  Patient had a seizure and seizure induced by drugs.  Reports fentanyl and methadone  use and xanax.  Patient had a seizure before narcan when he was in the shower and fanily hear him fall..  A&Ox3 at this time.

## 2025-02-01 NOTE — ED Notes (Incomplete)
 Trauma Response Nurse Documentation   Tyler Graves is a 39 y.o. male arriving to [facility] ED via [facility]  On No antithrombotic. Trauma was activated as a Level 2 by TRN  based on the following trauma criteria GCS 10-14 associated with trauma or AVPU < A.  Patient cleared for CT by Dr. Pamella and PA . Pt transported to CT with primary nurse present to monitor. RN remained with the patient throughout their absence from the department for clinical observation.   GCS 15.  History   Past Medical History:  Diagnosis Date   Anxiety    Mild fentanyl abuse (HCC)    severe     History reviewed. No pertinent surgical history.     Initial Focused Assessment (If applicable, or please see trauma documentation): Airway - clear Breathing - unlabored Circulation - strong peripheral pulses GCS - 15 -- was 14 on EMS arrival and was more unresponsive prior to Narcan given by EMS.   CT's Completed:   CT Head, CT Maxillofacial, and CT C-Spine   Interventions:  Labs Xrays CT scans  Plan for disposition:  {Trauma Dispo:26867}   Consults completed:  {Trauma Consults:26862} at ***.  Event Summary: Pt states he must have passed out in the shower. Denies using any opiates today, states last use was yesterday. Is currently in a Methadone  clinic, and continues to use Fentanyl from the street- the clinic is unaware of this.  EMS arrived, Pt had received Narcan 4mg  IN by family member.  Per family member  MTP Summary (If applicable):     Tyler Graves  Trauma Response RN  Please call TRN at [phone] for further assistance.

## 2025-02-01 NOTE — ED Notes (Signed)
 NT cleaned pt face

## 2025-02-01 NOTE — Progress Notes (Signed)
 Orthopedic Tech Progress Note Patient Details:  Tyler Graves February 14, 1986 [id number] Level 2 Trauma. Not needed Patient ID: Tyler Graves, male   DOB: September 01, 1986, 40 y.o.   MRN: [id number]  Tyler Graves 02/01/2025, 12:27 PM

## 2025-02-01 NOTE — Progress Notes (Signed)
 Pt currently in CT and not available

## 2025-02-01 NOTE — Progress Notes (Cosign Needed)
 Routine EEG completed, results pending Neurology review and interpretation

## 2025-02-01 NOTE — ED Provider Notes (Incomplete)
 " Tyler Graves Provider Note   CSN: [id number] Arrival date & time: 02/01/25  1100     Patient presents with: Drug Overdose and Seizures   Tyler Graves is a 39 y.o. male with history of polysubstance abuse presents following reported seizure versus syncope.  Patient reports that he had not been using any drugs for the past few days.  He was taking a shower this morning around 10 AM and then woke up in the back of ambulance.  Does not recall any preceding chest pain or shortness of breath.  Does report history of 1 other seizure years ago in similar setting.  No cardiac history.  No history of PE.   He is on methadone .  No tongue lacerations, no urinary incontinence.  Family at bedside reports that they heard the fall and came in and saw him having a full body seizure.  Lasted for few minutes.  His brother administered Narcan.  Few minutes later his seizure resolved.  Notes that he was confused for approximate 15 minutes.  {Add pertinent medical, surgical, social history, OB history to YEP:67052}  Drug Overdose  Seizures     Past Medical History:  Diagnosis Date   Anxiety    Mild fentanyl abuse (HCC)    severe   History reviewed. No pertinent surgical history.   Prior to Admission medications  Medication Sig Start Date End Date Taking? Authorizing Provider  methadone  (DOLOPHINE ) 10 MG/ML solution Take 150 mg by mouth daily.   Yes [provider]    Allergies: Patient has no known allergies.    Review of Systems  Neurological:  Positive for seizures.    Updated Vital Signs BP 133/88   Pulse 70   Temp 98.5 F (36.9 C)   Resp 10   Ht 5' 7 (1.702 m)   Wt 83.9 kg   SpO2 96%   BMI 28.97 kg/m   Physical Exam Vitals and nursing note reviewed.  Constitutional:      General: He is not in acute distress.    Appearance: He is well-developed.  HENT:     Head:     Comments: Right sided supraorbital hematoma and  superficial infraorbital abrasion, otherwise normocephalic atraumatic Eyes:     Conjunctiva/sclera: Conjunctivae normal.  Cardiovascular:     Rate and Rhythm: Normal rate and regular rhythm.     Heart sounds: No murmur heard. Pulmonary:     Effort: Pulmonary effort is normal. No respiratory distress.     Breath sounds: Normal breath sounds.  Abdominal:     Palpations: Abdomen is soft.     Tenderness: There is no abdominal tenderness.  Musculoskeletal:        General: No swelling.     Cervical back: Neck supple.  Skin:    General: Skin is warm and dry.     Capillary Refill: Capillary refill takes less than 2 seconds.  Neurological:     Mental Status: He is alert.  Psychiatric:        Mood and Affect: Mood normal.     (all labs ordered are listed, but only abnormal results are displayed) Labs Reviewed  COMPREHENSIVE METABOLIC PANEL WITH GFR - Abnormal; Notable for the following components:      Result Value   Glucose, Bld 122 (*)    Calcium 8.6 (*)    Total Protein 8.5 (*)    All other components within normal limits  CBC - Abnormal; Notable for the following  components:   WBC 10.9 (*)    All other components within normal limits  URINALYSIS, ROUTINE W REFLEX MICROSCOPIC - Abnormal; Notable for the following components:   APPearance TURBID (*)    Hgb urine dipstick SMALL (*)    Ketones, ur 5 (*)    Protein, ur 100 (*)    Leukocytes,Ua TRACE (*)    Bacteria, UA RARE (*)    Non Squamous Epithelial 0-5 (*)    All other components within normal limits  URINE DRUG SCREEN - Abnormal; Notable for the following components:   Opiates POSITIVE (*)    Benzodiazepines POSITIVE (*)    Methadone  Scn, Ur POSITIVE (*)    Fentanyl POSITIVE (*)    All other components within normal limits  I-STAT CHEM 8, ED - Abnormal; Notable for the following components:   Glucose, Bld 123 (*)    All other components within normal limits  I-STAT CG4 LACTIC ACID, ED - Abnormal; Notable for the  following components:   Lactic Acid, Venous 2.0 (*)    All other components within normal limits  ETHANOL  PROTIME-INR  CK  ACETAMINOPHEN  LEVEL  GAMMA GT  I-STAT CG4 LACTIC ACID, ED  SAMPLE TO BLOOD BANK    EKG: None  Radiology: CT HEAD WO CONTRAST Result Date: 02/01/2025 EXAM: CT HEAD, FACIAL BONES AND CERVICAL SPINE WITHOUT CONTRAST 02/01/2025 12:05:27 PM TECHNIQUE: CT of the head, facial bones and cervical spine was performed without the administration of intravenous contrast. Multiplanar reformatted images are provided for review. Automated exposure control, iterative reconstruction, and/or weight based adjustment of the mA/kV was utilized to reduce the radiation dose to as low as reasonably achievable. COMPARISON: None available. CLINICAL HISTORY: Head trauma, moderate-severe Head trauma, moderate-severe. FINDINGS: CT HEAD BRAIN AND VENTRICLES: No acute intracranial hemorrhage. No mass effect or midline shift. No extra-axial fluid collection. No evidence of acute infarct. No hydrocephalus. SKULL AND SCALP: No acute skull fracture. No scalp hematoma. CT FACIAL BONES FACIAL BONES: No acute facial fracture. No mandibular dislocation. No suspicious bone lesion. ORBITS: No acute traumatic injury. SINUSES AND MASTOIDS: Left greater than right maxillary sinus mucosal thickening. SOFT TISSUES: Large right periorbital/forehead contusion. CT CERVICAL SPINE BONES AND ALIGNMENT: No acute fracture or traumatic malalignment. DEGENERATIVE CHANGES: No significant degenerative changes. SOFT TISSUES: Large right periorbital/forehead contusion. IMPRESSION: 1. No acute intracranial abnormality. 2. No acute fracture or traumatic malalignment of the cervical spine. 3. No acute fracture of the facial bones. 4. Large right periorbital/forehead contusion. Electronically signed by: Glendia Molt MD 02/01/2025 12:30 PM EST RP Workstation: [id number]   CT MAXILLOFACIAL WO CONTRAST Result Date: 02/01/2025 EXAM: CT HEAD,  FACIAL BONES AND CERVICAL SPINE WITHOUT CONTRAST 02/01/2025 12:05:27 PM TECHNIQUE: CT of the head, facial bones and cervical spine was performed without the administration of intravenous contrast. Multiplanar reformatted images are provided for review. Automated exposure control, iterative reconstruction, and/or weight based adjustment of the mA/kV was utilized to reduce the radiation dose to as low as reasonably achievable. COMPARISON: None available. CLINICAL HISTORY: Head trauma, moderate-severe Head trauma, moderate-severe. FINDINGS: CT HEAD BRAIN AND VENTRICLES: No acute intracranial hemorrhage. No mass effect or midline shift. No extra-axial fluid collection. No evidence of acute infarct. No hydrocephalus. SKULL AND SCALP: No acute skull fracture. No scalp hematoma. CT FACIAL BONES FACIAL BONES: No acute facial fracture. No mandibular dislocation. No suspicious bone lesion. ORBITS: No acute traumatic injury. SINUSES AND MASTOIDS: Left greater than right maxillary sinus mucosal thickening. SOFT TISSUES: Large right periorbital/forehead contusion. CT  CERVICAL SPINE BONES AND ALIGNMENT: No acute fracture or traumatic malalignment. DEGENERATIVE CHANGES: No significant degenerative changes. SOFT TISSUES: Large right periorbital/forehead contusion. IMPRESSION: 1. No acute intracranial abnormality. 2. No acute fracture or traumatic malalignment of the cervical spine. 3. No acute fracture of the facial bones. 4. Large right periorbital/forehead contusion. Electronically signed by: Glendia Molt MD 02/01/2025 12:30 PM EST RP Workstation: [id number]   CT CERVICAL SPINE WO CONTRAST Result Date: 02/01/2025 EXAM: CT HEAD, FACIAL BONES AND CERVICAL SPINE WITHOUT CONTRAST 02/01/2025 12:05:27 PM TECHNIQUE: CT of the head, facial bones and cervical spine was performed without the administration of intravenous contrast. Multiplanar reformatted images are provided for review. Automated exposure control, iterative reconstruction,  and/or weight based adjustment of the mA/kV was utilized to reduce the radiation dose to as low as reasonably achievable. COMPARISON: None available. CLINICAL HISTORY: Head trauma, moderate-severe Head trauma, moderate-severe. FINDINGS: CT HEAD BRAIN AND VENTRICLES: No acute intracranial hemorrhage. No mass effect or midline shift. No extra-axial fluid collection. No evidence of acute infarct. No hydrocephalus. SKULL AND SCALP: No acute skull fracture. No scalp hematoma. CT FACIAL BONES FACIAL BONES: No acute facial fracture. No mandibular dislocation. No suspicious bone lesion. ORBITS: No acute traumatic injury. SINUSES AND MASTOIDS: Left greater than right maxillary sinus mucosal thickening. SOFT TISSUES: Large right periorbital/forehead contusion. CT CERVICAL SPINE BONES AND ALIGNMENT: No acute fracture or traumatic malalignment. DEGENERATIVE CHANGES: No significant degenerative changes. SOFT TISSUES: Large right periorbital/forehead contusion. IMPRESSION: 1. No acute intracranial abnormality. 2. No acute fracture or traumatic malalignment of the cervical spine. 3. No acute fracture of the facial bones. 4. Large right periorbital/forehead contusion. Electronically signed by: Glendia Molt MD 02/01/2025 12:30 PM EST RP Workstation: [id number]   DG Pelvis Portable Result Date: 02/01/2025 CLINICAL DATA:  Status post trauma. EXAM: PORTABLE PELVIS 1-2 VIEWS COMPARISON:  None Available. FINDINGS: There is no evidence of pelvic fracture or diastasis. No pelvic bone lesions are seen. IMPRESSION: Negative. Electronically Signed   By: Suzen Dials M.D.   On: 02/01/2025 12:27   DG Chest Port 1 View Result Date: 02/01/2025 CLINICAL DATA:  Status post trauma. EXAM: PORTABLE CHEST 1 VIEW COMPARISON:  August 09, 2014 FINDINGS: The heart size and mediastinal contours are within normal limits. Both lungs are clear. The visualized skeletal structures are unremarkable. IMPRESSION: No active disease. Electronically Signed    By: Suzen Dials M.D.   On: 02/01/2025 12:25    {Document cardiac monitor, telemetry assessment procedure when appropriate:32947} Procedures   Medications Ordered in the ED  sodium chloride  0.9 % bolus 1,000 mL (1,000 mLs Intravenous Not Given 02/01/25 1232)  levETIRAcetam  (KEPPRA ) undiluted injection 1,500 mg (0 mg Intravenous Hold 02/01/25 1231)  LORazepam  (ATIVAN ) injection 0-4 mg (1 mg Intravenous Given 02/01/25 1245)    Or  LORazepam  (ATIVAN ) tablet 0-4 mg ( Oral See Alternative 02/01/25 1245)  LORazepam  (ATIVAN ) injection 0-4 mg (has no administration in time range)    Or  LORazepam  (ATIVAN ) tablet 0-4 mg (has no administration in time range)  thiamine  (VITAMIN B1) tablet 100 mg ( Oral See Alternative 02/01/25 1238)    Or  thiamine  (VITAMIN B1) injection 100 mg (100 mg Intravenous Given 02/01/25 1238)  sodium chloride  0.9 % bolus 1,000 mL (1,000 mLs Intravenous New Bag/Given 02/01/25 1224)  ondansetron  (ZOFRAN ) injection 4 mg (4 mg Intravenous Given 02/01/25 1219)  methadone  (DOLOPHINE ) tablet 70 mg (70 mg Oral Given 02/01/25 1225)    Clinical Course as of 02/01/25 1501  Fri Feb 01, 2025  1155 Patient evaluated after being brought in by EMS for concerns for overdose versus syncope versus seizure in the setting of suspected withdrawal from Xanax.  Reportedly given an unknown amount of Narcan to the patient.  He woke up in the back of the ambulance.  Upon arrival he is hemodynamically stable and nontoxic-appearing.  GCS 15, symmetric lung sounds.  Does have a right sided supraorbital hematoma without infraorbital abrasion.  Hemostasis is achieved.  EOMI, PERRL.  Will begin with routine workup, CIWA/COWS initiated.   [JT]  1248 CBC(!) Mild leukocytosis [JT]  1248 Comprehensive metabolic panel(!) No significant abnormality [JT]  1255 Urine rapid drug screen (hosp performed)(!) Positive for fentanyl, methadone , benzos and opiates [JT]  1255 Urinalysis, Routine w reflex microscopic -Urine, Clean  Catch(!) Trace leukocytes negative nitrites, 21-50 WBCs with rare bacteria, squamous cells are present [JT]  1255 I-Stat Lactic Acid, ED(!!) Mildly elevated [JT]  1256 X-ray imaging of chest and pelvis are unremarkable, CT imaging is notable for large supraorbital hematoma without any intracranial abnormality [JT]  1432 Wounds will be cleansed, right eye abrasion amenable to Steri-Strips.  Patient is agreeable to this.  Discussed patient with hospitalist.  Agreed for admission, requested discussing patient with neurology for possible EEG [JT]  1446 Discussed patient with neurology, Dr. Michaela.  Feels that this is consistent with a withdrawal seizure.  An EEG would be reasonable otherwise no further neurology intervention [JT]    Clinical Course User Index [JT] Donnajean Lynwood DEL, PA-C   {Click here for ABCD2, HEART and other calculators REFRESH Note before signing:1}                              Medical Decision Making Amount and/or Complexity of Data Reviewed Labs: ordered. Decision-making details documented in ED Course. Radiology: ordered.  Risk OTC drugs. Prescription drug management. Decision regarding hospitalization.   This patient presents to the ED with chief complaint(s) of Fall .  The complaint involves an extensive differential diagnosis and also carries with it a high risk of complications and morbidity.   Pertinent past medical history as listed in HPI  The differential diagnosis includes  Fracture, dislocation, sprain, intracranial hemorrhage, concussion, seizure, syncope, arrhythmia Additional history obtained: Additional history obtained from EMS  Records reviewed Care Everywhere/External Records  Disposition:   Patient admitted for further management  Social Determinants of Health:   none  This note was dictated with voice recognition software.  Despite best efforts at proofreading, errors may have occurred which can change the documentation meaning.     {Document critical care time when appropriate  Document review of labs and clinical decision tools ie CHADS2VASC2, etc  Document your independent review of radiology images and any outside records  Document your discussion with family members, caretakers and with consultants  Document social determinants of health affecting pt's care  Document your decision making why or why not admission, treatments were needed:32947:::1}   Final diagnoses:  Fall, initial encounter  Seizure-like activity Graves [place])    ED Discharge Orders     None        "

## 2025-02-01 NOTE — ED Notes (Signed)
 Patient transported to CT
